# Patient Record
Sex: Male | Born: 1975
Health system: Southern US, Community
[De-identification: ages and names within clinical notes are randomized; demographics above are authoritative.]

## PROBLEM LIST (undated history)

## (undated) DIAGNOSIS — I10 Essential (primary) hypertension: Secondary | ICD-10-CM

## (undated) DIAGNOSIS — E7800 Pure hypercholesterolemia, unspecified: Secondary | ICD-10-CM

---

## 2011-02-10 ENCOUNTER — Emergency Department (HOSPITAL_BASED_OUTPATIENT_CLINIC_OR_DEPARTMENT_OTHER)
Admission: EM | Admit: 2011-02-10 | Discharge: 2011-02-10 | Disposition: A | Discharge status: Home / Self Care | Payer: Self-pay | Attending: Emergency Medicine | Admitting: Emergency Medicine

## 2011-02-10 DIAGNOSIS — F17200 Nicotine dependence, unspecified, uncomplicated: Secondary | ICD-10-CM | POA: Insufficient documentation

## 2011-02-10 DIAGNOSIS — R109 Unspecified abdominal pain: Secondary | ICD-10-CM | POA: Insufficient documentation

## 2011-02-10 LAB — URINALYSIS, ROUTINE W REFLEX MICROSCOPIC
Glucose, UA: NEGATIVE mg/dL
Hgb urine dipstick: NEGATIVE
Specific Gravity, Urine: 1.026 (ref 1.005–1.030)
pH: 6 (ref 5.0–8.0)

## 2011-02-12 LAB — GC/CHLAMYDIA PROBE AMP, GENITAL
Chlamydia, DNA Probe: NEGATIVE
GC Probe Amp, Genital: NEGATIVE

## 2013-07-09 ENCOUNTER — Emergency Department (HOSPITAL_BASED_OUTPATIENT_CLINIC_OR_DEPARTMENT_OTHER)
Admission: EM | Admit: 2013-07-09 | Discharge: 2013-07-10 | Disposition: A | Discharge status: Home / Self Care | Payer: Self-pay | Attending: Emergency Medicine | Admitting: Emergency Medicine

## 2013-07-09 ENCOUNTER — Encounter (HOSPITAL_BASED_OUTPATIENT_CLINIC_OR_DEPARTMENT_OTHER): Payer: Self-pay | Admitting: *Deleted

## 2013-07-09 ENCOUNTER — Emergency Department (HOSPITAL_BASED_OUTPATIENT_CLINIC_OR_DEPARTMENT_OTHER): Payer: Self-pay

## 2013-07-09 DIAGNOSIS — X58XXXA Exposure to other specified factors, initial encounter: Secondary | ICD-10-CM | POA: Insufficient documentation

## 2013-07-09 DIAGNOSIS — Y929 Unspecified place or not applicable: Secondary | ICD-10-CM | POA: Insufficient documentation

## 2013-07-09 DIAGNOSIS — S8392XA Sprain of unspecified site of left knee, initial encounter: Secondary | ICD-10-CM

## 2013-07-09 DIAGNOSIS — Y9389 Activity, other specified: Secondary | ICD-10-CM | POA: Insufficient documentation

## 2013-07-09 DIAGNOSIS — F17200 Nicotine dependence, unspecified, uncomplicated: Secondary | ICD-10-CM | POA: Insufficient documentation

## 2013-07-09 DIAGNOSIS — IMO0002 Reserved for concepts with insufficient information to code with codable children: Secondary | ICD-10-CM | POA: Insufficient documentation

## 2013-07-09 NOTE — ED Provider Notes (Signed)
CSN: [id number]     Arrival date & time 07/09/13  2326 History   First MD Initiated Contact with Patient 07/09/13 2336     Chief Complaint  Patient presents with  . Knee Injury   (Consider location/radiation/quality/duration/timing/severity/associated sxs/prior Treatment) Patient is a 37 y.o. male presenting with knee pain. The history is provided by the patient.  Knee Pain Location:  Knee Time since incident:  6 days Injury: yes   Mechanism of injury comment:  Playing basketball  Knee location:  L knee Pain details:    Quality:  Aching   Radiates to:  Does not radiate   Severity:  Moderate   Onset quality:  Sudden   Timing:  Intermittent   Progression:  Unchanged Chronicity:  New Dislocation: no   Foreign body present:  No foreign bodies Prior injury to area:  No Relieved by:  Nothing Worsened by:  Nothing tried Ineffective treatments:  None tried Associated symptoms: no back pain and no stiffness   Risk factors: no concern for non-accidental trauma     History reviewed. No pertinent past medical history. History reviewed. No pertinent past surgical history. History reviewed. No pertinent family history. History  Substance Use Topics  . Smoking status: Current Every Day Smoker -- 0.50 packs/day    Types: Cigarettes  . Smokeless tobacco: Not on file  . Alcohol Use: No    Review of Systems  Musculoskeletal: Negative for back pain and stiffness.  All other systems reviewed and are negative.    Allergies  Review of patient's allergies indicates no known allergies.  Home Medications  No current outpatient prescriptions on file. BP 154/86  Pulse 86  Temp(Src) 98.8 F (37.1 C) (Oral)  Resp 18  Ht 5\' 10"  (1.778 m)  Wt 182 lb (82.555 kg)  BMI 26.11 kg/m2  SpO2 100% Physical Exam  Constitutional: He is oriented to person, place, and time. He appears well-developed and well-nourished. No distress.  HENT:  Head: Normocephalic and atraumatic.  Mouth/Throat:  Oropharynx is clear and moist.  Eyes: Conjunctivae are normal. Pupils are equal, round, and reactive to light.  Neck: Normal range of motion. Neck supple.  Cardiovascular: Normal rate, regular rhythm and intact distal pulses.   Pulmonary/Chest: Effort normal and breath sounds normal. He has no wheezes. He has no rales.  Abdominal: Soft. Bowel sounds are normal. There is no tenderness. There is no rebound and no guarding.  Musculoskeletal: Normal range of motion. He exhibits no edema.  Intact quadriceps and patellar tendons.  Negative anterior and posterior drawer tests.  No laxity to varus or valgus stress.  No patella alta or [place]  Neurological: He is alert and oriented to person, place, and time. He has normal reflexes.  Skin: Skin is warm and dry.  Psychiatric: He has a normal mood and affect.    ED Course  Procedures (including critical care time) Labs Review Labs Reviewed - No data to display Imaging Review No results found.  MDM  No diagnosis found. Knee sleeve ice elevation and pain medication    Deniesha Stenglein K Almir Botts-Rasch, MD 07/10/13 [phone]

## 2013-07-09 NOTE — ED Notes (Signed)
MD at bedside. 

## 2013-07-09 NOTE — ED Notes (Signed)
Pt c/o left knee injury while playing basketball x 6 days

## 2013-07-10 MED ORDER — NAPROXEN 500 MG PO TABS: 500.0000 mg | ORAL_TABLET | Freq: Two times a day (BID) | ORAL | Status: DC

## 2013-07-10 MED ORDER — TRAMADOL HCL 50 MG PO TABS: 50.0000 mg | ORAL_TABLET | Freq: Four times a day (QID) | ORAL | Status: DC | PRN

## 2013-07-10 NOTE — ED Notes (Signed)
Patient transported to X-ray 

## 2014-03-10 ENCOUNTER — Encounter (HOSPITAL_BASED_OUTPATIENT_CLINIC_OR_DEPARTMENT_OTHER): Payer: Self-pay | Admitting: Emergency Medicine

## 2014-03-10 ENCOUNTER — Other Ambulatory Visit: Payer: Self-pay

## 2014-03-10 ENCOUNTER — Emergency Department (HOSPITAL_BASED_OUTPATIENT_CLINIC_OR_DEPARTMENT_OTHER)
Admission: EM | Admit: 2014-03-10 | Discharge: 2014-03-11 | Disposition: A | Discharge status: Home / Self Care | Payer: Self-pay | Attending: Emergency Medicine | Admitting: Emergency Medicine

## 2014-03-10 DIAGNOSIS — M5412 Radiculopathy, cervical region: Secondary | ICD-10-CM | POA: Insufficient documentation

## 2014-03-10 DIAGNOSIS — F17200 Nicotine dependence, unspecified, uncomplicated: Secondary | ICD-10-CM | POA: Insufficient documentation

## 2014-03-10 DIAGNOSIS — M5410 Radiculopathy, site unspecified: Secondary | ICD-10-CM

## 2014-03-10 DIAGNOSIS — Z791 Long term (current) use of non-steroidal anti-inflammatories (NSAID): Secondary | ICD-10-CM | POA: Insufficient documentation

## 2014-03-10 MED ORDER — KETOROLAC TROMETHAMINE 60 MG/2ML IM SOLN
60.0000 mg | Freq: Once | INTRAMUSCULAR | Status: AC
  Administered 2014-03-10: 60 mg via INTRAMUSCULAR
  Filled 2014-03-10: qty 2

## 2014-03-10 NOTE — ED Notes (Signed)
Neck pain for 2 weeks. Pain starts in the back of his neck and goes under his left arm and into his left chest.

## 2014-03-10 NOTE — ED Provider Notes (Signed)
CSN: [id number]     Arrival date & time 03/10/14  2005 History  This chart was scribed for Brendan Ruiz F Ranessa Kosta, MD by Luisa DagoPriscilla Tutu, ED Scribe. This patient was seen in room MH03/MH03 and the patient's care was started at 11:10 PM.     Chief Complaint  Patient presents with  . Neck Pain   The history is provided by the patient. No language interpreter was used.   HPI Comments: Brendan Ruiz is a 38 y.o. male who presents to the Emergency Department complaining of worsening neck pain that started approximately 2 weeks ago. Pt states that the pain is located in the back of his neck and radiates down into his left arm and around into the left side of his chest. He states that when he turns his neck a certain way he experiences a "shooting pain". Pt denies any recent injury or heavy lifting. He reports taking Ibuprofen with no relief. He currently rates his pain as a 5/10. He denies any pertinent medical history. Pt denies any fever, chills, or diaphoresis.   Pt does not have a PCP  History reviewed. No pertinent past medical history. History reviewed. No pertinent past surgical history. No family history on file. History  Substance Use Topics  . Smoking status: Current Every Day Smoker -- 0.50 packs/day    Types: Cigarettes  . Smokeless tobacco: Not on file  . Alcohol Use: No    Review of Systems  Musculoskeletal: Positive for neck pain. Negative for back pain.  Neurological: Negative for dizziness, weakness and numbness.  All other systems reviewed and are negative.     Allergies  Review of patient's allergies indicates no known allergies.  Home Medications   Prior to Admission medications   Medication Sig Start Date End Date Taking? Authorizing Provider  naproxen (NAPROSYN) 500 MG tablet Take 1 tablet (500 mg total) by mouth 2 (two) times daily. 07/10/13   April K Palumbo-Rasch, MD  traMADol (ULTRAM) 50 MG tablet Take 1 tablet (50 mg total) by mouth every 6 (six) hours as needed  for pain. 07/10/13   April K Palumbo-Rasch, MD   Triage Vitals: BP 148/84  Pulse 81  Temp(Src) 98.5 F (36.9 C) (Oral)  Resp 20  Ht 5\' 11"  (1.803 m)  Wt 195 lb (88.451 kg)  BMI 27.21 kg/m2  SpO2 100%  Physical Exam  Nursing note and vitals reviewed. Constitutional: He is oriented to person, place, and time. He appears well-developed and well-nourished.  HENT:  Head: Normocephalic and atraumatic.  Eyes: Pupils are equal, round, and reactive to light.  Neck: Normal range of motion. Neck supple.  Cardiovascular: Normal rate, regular rhythm and normal heart sounds.   No murmur heard. Pulmonary/Chest: Effort normal and breath sounds normal. No respiratory distress. He has no wheezes.  Musculoskeletal: He exhibits no edema.  Normal range of motion of the left elbow and shoulder, no obvious deformity or injury,   Lymphadenopathy:    He has no cervical adenopathy.  Neurological: He is alert and oriented to person, place, and time.  5 out of 5 deltoid, biceps, triceps, and grip strength in the bilateral opportunities, sensation intact  Skin: Skin is warm and dry.  Psychiatric: He has a normal mood and affect.    ED Course  Procedures (including critical care time)  DIAGNOSTIC STUDIES: Oxygen Saturation is 100% on RA, normal by my interpretation.    COORDINATION OF CARE: 11:13 PM-Patient / Family / Caregiver informed of clinical course, understand medical decision-making  process, and agree with plan.    Labs Review Labs Reviewed - No data to display  Imaging Review No results found.   EKG Interpretation None      Medications  ketorolac (TORADOL) injection 60 mg (60 mg Intramuscular Given 03/10/14 2346)     MDM   Final diagnoses:  Radiculopathy of arm    Patient presents with pain in his neck that shoots down his left arm. He is nontoxic-appearing. History is most consistent with radicular pain given that the pain is worse with neck movement. No known injury. On  physical exam, patient has full right and sensation and is only complaining of pain. Given reassuring neurologic exam, do not feel imaging is needed at this time. Patient was given Toradol. He will be discharged home on a Medrol Dosepak and a short course of Norco. He was referred to sports medicine for further evaluation.  After history, exam, and medical workup I feel the patient has been appropriately medically screened and is safe for discharge home. Pertinent diagnoses were discussed with the patient. Patient was given return precautions.  I personally performed the services described in this documentation, which was scribed in my presence. The recorded information has been reviewed and is accurate.    Brendan Ruiz F [name], MD 03/11/14 [phone]

## 2014-03-11 MED ORDER — METHYLPREDNISOLONE (PAK) 4 MG PO TABS: ORAL_TABLET | ORAL | Status: DC

## 2014-03-11 MED ORDER — HYDROCODONE-ACETAMINOPHEN 5-325 MG PO TABS: 1.0000 | ORAL_TABLET | Freq: Four times a day (QID) | ORAL | Status: DC | PRN

## 2014-03-11 NOTE — Discharge Instructions (Signed)
Cervical Radiculopathy  Cervical radiculopathy happens when a nerve in the neck is pinched or bruised by a slipped (herniated) disk or by arthritic changes in the bones of the cervical spine. This can occur due to an injury or as part of the normal aging process. Pressure on the cervical nerves can cause pain or numbness that runs from your neck all the way down into your arm and fingers.  CAUSES   There are many possible causes, including:  · Injury.  · Muscle tightness in the neck from overuse.  · Swollen, painful joints (arthritis).  · Breakdown or degeneration in the bones and joints of the spine (spondylosis) due to aging.  · Bone spurs that may develop near the cervical nerves.  SYMPTOMS   Symptoms include pain, weakness, or numbness in the affected arm and hand. Pain can be severe or irritating. Symptoms may be worse when extending or turning the neck.  DIAGNOSIS   Your caregiver will ask about your symptoms and do a physical exam. He or she may test your strength and reflexes. X-rays, CT scans, and MRI scans may be needed in cases of injury or if the symptoms do not go away after a period of time. Electromyography (EMG) or nerve conduction testing may be done to study how your nerves and muscles are working.  TREATMENT   Your caregiver may recommend certain exercises to help relieve your symptoms. Cervical radiculopathy can, and often does, get better with time and treatment. If your problems continue, treatment options may include:  · Wearing a soft collar for short periods of time.  · Physical therapy to strengthen the neck muscles.  · Medicines, such as nonsteroidal anti-inflammatory drugs (NSAIDs), oral corticosteroids, or spinal injections.  · Surgery. Different types of surgery may be done depending on the cause of your problems.  HOME CARE INSTRUCTIONS   · Put ice on the affected area.  · Put ice in a plastic bag.  · Place a towel between your skin and the bag.  · Leave the ice on for 15-20 minutes,  03-04 times a day or as directed by your caregiver.  · If ice does not help, you can try using heat. Take a warm shower or bath, or use a hot water bottle as directed by your caregiver.  · You may try a gentle neck and shoulder massage.  · Use a flat pillow when you sleep.  · Only take over-the-counter or prescription medicines for pain, discomfort, or fever as directed by your caregiver.  · If physical therapy was prescribed, follow your caregiver's directions.  · If a soft collar was prescribed, use it as directed.  SEEK IMMEDIATE MEDICAL CARE IF:   · Your pain gets much worse and cannot be controlled with medicines.  · You have weakness or numbness in your hand, arm, face, or leg.  · You have a high fever or a stiff, rigid neck.  · You lose bowel or bladder control (incontinence).  · You have trouble with walking, balance, or speaking.  MAKE SURE YOU:   · Understand these instructions.  · Will watch your condition.  · Will get help right away if you are not doing well or get worse.  Document Released: 07/16/2001 Document Revised: 01/13/2012 Document Reviewed: 06/04/2011  [employer]® Patient Information ©2014 [employer].

## 2014-03-21 ENCOUNTER — Ambulatory Visit (INDEPENDENT_AMBULATORY_CARE_PROVIDER_SITE_OTHER): Payer: Self-pay | Admitting: Family Medicine

## 2014-03-21 ENCOUNTER — Encounter: Payer: Self-pay | Admitting: Family Medicine

## 2014-03-21 VITALS — BP 158/99 | HR 114 | Ht 70.0 in | Wt 205.0 lb

## 2014-03-21 DIAGNOSIS — M542 Cervicalgia: Secondary | ICD-10-CM

## 2014-03-21 MED ORDER — CYCLOBENZAPRINE HCL 10 MG PO TABS: 10.0000 mg | ORAL_TABLET | Freq: Three times a day (TID) | ORAL | Status: DC | PRN

## 2014-03-21 MED ORDER — PREDNISONE (PAK) 10 MG PO TABS: ORAL_TABLET | ORAL | Status: DC

## 2014-03-21 MED ORDER — HYDROCODONE-ACETAMINOPHEN 5-325 MG PO TABS: 1.0000 | ORAL_TABLET | Freq: Four times a day (QID) | ORAL | Status: DC | PRN

## 2014-03-21 NOTE — Patient Instructions (Signed)
You have cervical radiculopathy (a pinched nerve in the neck). Prednisone 12 day dose pack to relieve irritation/inflammation of the nerve. Aleve 2 tabs twice a day with food for pain and inflammation - start day AFTER finishing prednisone if prescribed this. Flexeril three times a day as needed for muscle spasms (can make you sleepy - if so do not drive while taking this). Norco as needed for severe pain (no driving on this medicine). Consider cervical collar if severely painful. Simple range of motion exercises within limits of pain to prevent further stiffness. Consider physical therapy for stretching, exercises, traction, and modalities - call us when you know about cone coverage, insurance and we can order this. Heat 15 minutes at a time 3-4 times a day to help with spasms. Watch head position when on computers, texting, when sleeping in bed - should in line with back to prevent further nerve traction and irritation. If not improving we will consider an MRI. Follow up with me in 1 month.

## 2014-03-22 ENCOUNTER — Encounter: Payer: Self-pay | Admitting: Family Medicine

## 2014-03-22 DIAGNOSIS — M542 Cervicalgia: Secondary | ICD-10-CM | POA: Insufficient documentation

## 2014-03-22 NOTE — Progress Notes (Signed)
Patient ID: Brendan Ruiz, male   DOB: 1976-08-21, 38 y.o.   MRN: [id number]  PCP: No PCP Per Patient  Subjective:   HPI: Patient is a 38 y.o. male here for left shoulder, neck pain.  Patient reports pain started about a month ago when he felt like he slept wrong. Woke up with pain in left side of neck, chest radiating to left shoulder and back of left arm. Worse in the mornings. In ED was evaluated and put on medrol dose pack which did provide him with improvement. No prior issues. No weakness. No bowel/bladder dysfunction. Some numbness in areas mentioned above.  History reviewed. No pertinent past medical history.  Current Outpatient Prescriptions on File Prior to Visit  Medication Sig Dispense Refill  . naproxen (NAPROSYN) 500 MG tablet Take 1 tablet (500 mg total) by mouth 2 (two) times daily.  30 tablet  0   No current facility-administered medications on file prior to visit.    History reviewed. No pertinent past surgical history.  No Known Allergies  History   Social History  . Marital Status: Single    Spouse Name: N/A    Number of Children: N/A  . Years of Education: N/A   Occupational History  . Not on file.   Social History Main Topics  . Smoking status: Current Every Day Smoker -- 0.50 packs/day    Types: Cigarettes  . Smokeless tobacco: Not on file  . Alcohol Use: No  . Drug Use: No  . Sexual Activity: No   Other Topics Concern  . Not on file   Social History Narrative  . No narrative on file    History reviewed. No pertinent family history.  BP 158/99  Pulse 114  Ht 5\' 10"  (1.778 m)  Wt 205 lb (92.987 kg)  BMI 29.41 kg/m2  Review of Systems: See HPI above.    Objective:  Physical Exam:  Gen: NAD  Neck: No gross deformity, swelling, bruising. TTP mildly left trapezius, cervical paraspinal region.  No midline/bony TTP. FROM neck - pain with right lateral rotation reproducing his pain.Marland Kitchen. BUE strength 5/5.   Sensation intact to  light touch.   2+ equal reflexes in triceps, biceps, brachioradialis tendons. Positive spurlings on left. NV intact distal BUEs.    Assessment & Plan:  1. Neck pain - 2/2 radiculopathy.  Incomplete relief with a 6 day steroid dose pack - will provide 12 day dose pack to see if this will help with remaining symptoms.  Flexeril, norco as needed.  Unfortunately does not have insurance coverage to start physical therapy - will work on this and call us when this is the case.  F/u in 1 month

## 2014-03-22 NOTE — Assessment & Plan Note (Signed)
2/2 radiculopathy.  Incomplete relief with a 6 day steroid dose pack - will provide 12 day dose pack to see if this will help with remaining symptoms.  Flexeril, norco as needed.  Unfortunately does not have insurance coverage to start physical therapy - will work on this and call us when this is the case.  F/u in 1 month

## 2014-04-18 ENCOUNTER — Ambulatory Visit (INDEPENDENT_AMBULATORY_CARE_PROVIDER_SITE_OTHER): Payer: Self-pay | Admitting: Family Medicine

## 2014-04-18 ENCOUNTER — Encounter: Payer: Self-pay | Admitting: Family Medicine

## 2014-04-18 VITALS — BP 144/94 | HR 106 | Ht 70.0 in | Wt 205.0 lb

## 2014-04-18 DIAGNOSIS — M542 Cervicalgia: Secondary | ICD-10-CM

## 2014-04-19 ENCOUNTER — Encounter: Payer: Self-pay | Admitting: Family Medicine

## 2014-04-19 NOTE — Progress Notes (Signed)
Patient ID: Brendan Ruiz, male   DOB: 09-Oct-1976, 38 y.o.   MRN: [id number]  PCP: No PCP Per Patient  Subjective:   HPI: Patient is a 38 y.o. male here for left shoulder, neck pain.  5/18: Patient reports pain started about a month ago when he felt like he slept wrong. Woke up with pain in left side of neck, chest radiating to left shoulder and back of left arm. Worse in the mornings. In ED was evaluated and put on medrol dose pack which did provide him with improvement. No prior issues. No weakness. No bowel/bladder dysfunction. Some numbness in areas mentioned above.  6/15: Patient reports he feels better but still with 3/10 pain. 12 day prednisone dose pack helped. Has pain at times still. ROM is improved. Still doesn't have insurance - is changing jobs and will have to wait on benefits there. Hasn't done PT as a result. Left arm does feel weak when holding something. Chest has improved too. Occasionally takes muscle relaxant. No night pain.  History reviewed. No pertinent past medical history.  Current Outpatient Prescriptions on File Prior to Visit  Medication Sig Dispense Refill  . cyclobenzaprine (FLEXERIL) 10 MG tablet Take 1 tablet (10 mg total) by mouth every 8 (eight) hours as needed for muscle spasms.  60 tablet  1  . HYDROcodone-acetaminophen (NORCO/VICODIN) 5-325 MG per tablet Take 1 tablet by mouth every 6 (six) hours as needed for severe pain.  60 tablet  0  . naproxen (NAPROSYN) 500 MG tablet Take 1 tablet (500 mg total) by mouth 2 (two) times daily.  30 tablet  0  . predniSONE (STERAPRED UNI-PAK) 10 MG tablet 6 tabs po days 1-2, 5 tabs po days 3-4, 4 tabs po days 5-6, 3 tabs po days 7-8, 2 tabs po days 9-10, 1 tab po days 11-12  42 tablet  0   No current facility-administered medications on file prior to visit.    History reviewed. No pertinent past surgical history.  No Known Allergies  History   Social History  . Marital Status: Single    Spouse  Name: N/A    Number of Children: N/A  . Years of Education: N/A   Occupational History  . Not on file.   Social History Main Topics  . Smoking status: Current Every Day Smoker -- 0.50 packs/day    Types: Cigarettes  . Smokeless tobacco: Not on file  . Alcohol Use: No  . Drug Use: No  . Sexual Activity: No   Other Topics Concern  . Not on file   Social History Narrative  . No narrative on file    History reviewed. No pertinent family history.  BP 144/94  Pulse 106  Ht 5\' 10"  (1.778 m)  Wt 205 lb (92.987 kg)  BMI 29.41 kg/m2  Review of Systems: See HPI above.    Objective:  Physical Exam:  Gen: NAD  Neck: No gross deformity, swelling, bruising. TTP minimally left trapezius, cervical paraspinal region.  No midline/bony TTP. FROM neck - minimal pain with right lateral rotation reproducing his pain.Marland Kitchen. BUE strength 5/5.   Sensation intact to light touch.   2+ equal reflexes in triceps, biceps, brachioradialis tendons. Negative spurlings on left. NV intact distal BUEs.    Assessment & Plan:  1. Neck pain - 2/2 radiculopathy.  Better with 12 day dose pack.  Still awaiting insurance to go ahead with therapy - advised him to call us when this is the case and if he's still  having difficulties (if completely improved would not do therapy).  Flexeril as needed.  Otherwise f/u prn.

## 2014-04-19 NOTE — Assessment & Plan Note (Signed)
2/2 radiculopathy.  Better with 12 day dose pack.  Still awaiting insurance to go ahead with therapy - advised him to call us when this is the case and if he's still having difficulties (if completely improved would not do therapy).  Flexeril as needed.  Otherwise f/u prn.

## 2016-02-04 ENCOUNTER — Emergency Department (HOSPITAL_BASED_OUTPATIENT_CLINIC_OR_DEPARTMENT_OTHER)
Admission: EM | Admit: 2016-02-04 | Discharge: 2016-02-04 | Disposition: A | Discharge status: Home / Self Care | Payer: Managed Care, Other (non HMO) | Attending: Emergency Medicine | Admitting: Emergency Medicine

## 2016-02-04 ENCOUNTER — Encounter (HOSPITAL_BASED_OUTPATIENT_CLINIC_OR_DEPARTMENT_OTHER): Payer: Self-pay | Admitting: *Deleted

## 2016-02-04 DIAGNOSIS — J069 Acute upper respiratory infection, unspecified: Secondary | ICD-10-CM | POA: Diagnosis not present

## 2016-02-04 DIAGNOSIS — Z791 Long term (current) use of non-steroidal anti-inflammatories (NSAID): Secondary | ICD-10-CM | POA: Diagnosis not present

## 2016-02-04 DIAGNOSIS — F17210 Nicotine dependence, cigarettes, uncomplicated: Secondary | ICD-10-CM | POA: Insufficient documentation

## 2016-02-04 DIAGNOSIS — X58XXXA Exposure to other specified factors, initial encounter: Secondary | ICD-10-CM | POA: Insufficient documentation

## 2016-02-04 DIAGNOSIS — Y9289 Other specified places as the place of occurrence of the external cause: Secondary | ICD-10-CM | POA: Insufficient documentation

## 2016-02-04 DIAGNOSIS — Z7951 Long term (current) use of inhaled steroids: Secondary | ICD-10-CM | POA: Diagnosis not present

## 2016-02-04 DIAGNOSIS — S161XXA Strain of muscle, fascia and tendon at neck level, initial encounter: Secondary | ICD-10-CM | POA: Diagnosis not present

## 2016-02-04 DIAGNOSIS — Y998 Other external cause status: Secondary | ICD-10-CM | POA: Insufficient documentation

## 2016-02-04 DIAGNOSIS — J029 Acute pharyngitis, unspecified: Secondary | ICD-10-CM | POA: Diagnosis present

## 2016-02-04 DIAGNOSIS — Y9389 Activity, other specified: Secondary | ICD-10-CM | POA: Diagnosis not present

## 2016-02-04 LAB — RAPID STREP SCREEN (MED CTR MEBANE ONLY): STREPTOCOCCUS, GROUP A SCREEN (DIRECT): NEGATIVE

## 2016-02-04 MED ORDER — FLUTICASONE PROPIONATE 50 MCG/ACT NA SUSP: 2.0000 | Freq: Every day | NASAL | Status: DC

## 2016-02-04 MED ORDER — IBUPROFEN 800 MG PO TABS: 800.0000 mg | ORAL_TABLET | Freq: Three times a day (TID) | ORAL | Status: DC

## 2016-02-04 NOTE — ED Notes (Signed)
Rx x 2 given at discharge for flonase and ibuprofen. Note for work given

## 2016-02-04 NOTE — Discharge Instructions (Signed)
1. Medications: flonase for nasal congestion, ibuprofen as needed for neck pain, continue usual home medications 2. Treatment: rest, drink plenty of fluids 3. Follow Up: Please follow up with your primary doctor in 3 days for discussion of your diagnoses and further evaluation after today's visit; Return to the ER for high fevers, difficulty breathing or other concerning symptoms

## 2016-02-04 NOTE — ED Notes (Signed)
Sore throat x 3 days

## 2016-02-04 NOTE — ED Provider Notes (Signed)
CSN: [id number]     Arrival date & time 02/04/16  0945 History   First MD Initiated Contact with Patient 02/04/16 1000     Chief Complaint  Patient presents with  . Sore Throat     (Consider location/radiation/quality/duration/timing/severity/associated sxs/prior Treatment) Patient is a 40 y.o. male presenting with pharyngitis. The history is provided by the patient and medical records. No language interpreter was used.  Sore Throat Associated symptoms include congestion, myalgias, neck pain and a sore throat. Pertinent negatives include no abdominal pain, coughing, fever, headaches, nausea, rash, vomiting or weakness.  Brendan Ruiz is a 40 y.o. male  with no pertinent PMH who presents to the Emergency Department complaining of three day history of persistent, unchanging sore throat. Associated symptoms include nasal congestion. Alka-Seltzer and salt water gargles tried with Rotter relief. Sore throat worse with swallowing. Young son at home recently diagnosed with flu, no other sick contacts. Patient is also complaining of right-sided neck pain that began yesterday. 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 Patient denies fever, cough, shortness of breath, abdominal pain, chest pain.   History reviewed. No pertinent past medical history. History reviewed. No pertinent past surgical history. No family history on file. Social History  Substance Use Topics  . Smoking status: Current Every Day Smoker -- 0.50 packs/day    Types: Cigarettes  . Smokeless tobacco: Never Used  . Alcohol Use: No    Review of Systems  Constitutional: Negative for fever.  HENT: Positive for congestion and sore throat.   Eyes: Negative for visual disturbance.  Respiratory: Negative for cough and shortness of breath.   Cardiovascular: Negative.   Gastrointestinal: Negative for nausea, vomiting and abdominal pain.  Musculoskeletal: Positive for myalgias and neck  pain. Negative for back pain.  Skin: Negative for rash.  Allergic/Immunologic: Negative for immunocompromised state.  Neurological: Negative for dizziness, weakness and headaches.      Allergies  Review of patient's allergies indicates no known allergies.  Home Medications   Prior to Admission medications   Medication Sig Start Date End Date Taking? Authorizing Provider  cyclobenzaprine (FLEXERIL) 10 MG tablet Take 1 tablet (10 mg total) by mouth every 8 (eight) hours as needed for muscle spasms. 03/21/14   Lenda KelpShane R Hudnall, MD  fluticasone (FLONASE) 50 MCG/ACT nasal spray Place 2 sprays into both nostrils daily. 02/04/16   Chase PicketJaime Pilcher Mica Releford, PA-C  HYDROcodone-acetaminophen (NORCO/VICODIN) 5-325 MG per tablet Take 1 tablet by mouth every 6 (six) hours as needed for severe pain. 03/21/14   Lenda KelpShane R Hudnall, MD  ibuprofen (ADVIL,MOTRIN) 800 MG tablet Take 1 tablet (800 mg total) by mouth 3 (three) times daily. 02/04/16   Chase PicketJaime Pilcher Irelyn Perfecto, PA-C  naproxen (NAPROSYN) 500 MG tablet Take 1 tablet (500 mg total) by mouth 2 (two) times daily. 07/10/13   April Palumbo, MD  predniSONE (STERAPRED UNI-PAK) 10 MG tablet 6 tabs po days 1-2, 5 tabs po days 3-4, 4 tabs po days 5-6, 3 tabs po days 7-8, 2 tabs po days 9-10, 1 tab po days 11-12 03/21/14   Lenda KelpShane R Hudnall, MD   BP 139/88 mmHg  Pulse 64  Temp(Src) 98.3 F (36.8 C) (Oral)  Resp 18  Ht 5\' 11"  (1.803 m)  Wt 85.503 kg  BMI 26.30 kg/m2  SpO2 100% Physical Exam  Constitutional: He is oriented to person, place, and time. He appears well-developed and well-nourished. No distress.  HENT:  Head: Normocephalic and  atraumatic.  OP with erythema, no exudates or tonsillar hypertrophy. + nasal congestion with mucosal edema.   Neck: Normal range of motion. Neck supple.    TTP as depicted in image. No overlying skin changes. No neck, oral, or facial swelling. No meningeal signs.   Cardiovascular: Normal rate, regular rhythm and normal heart sounds.    Pulmonary/Chest: Effort normal.  Lungs are clear to auscultation bilaterally - no w/r/r  Abdominal: Soft. He exhibits no distension. There is no tenderness.  Musculoskeletal: Normal range of motion.  Neurological: He is alert and oriented to person, place, and time.  Skin: Skin is warm and dry. He is not diaphoretic.  Nursing note and vitals reviewed.   ED Course  Procedures (including critical care time) Labs Review Labs Reviewed  RAPID STREP SCREEN (NOT AT [facility])    Imaging Review No results found. I have personally reviewed and evaluated these images and lab results as part of my medical decision-making.   EKG Interpretation None      MDM   Final diagnoses:  URI (upper respiratory infection)  Neck strain, initial encounter   Brendan Ruiz is afebrile, non-toxic appearing with a clear lung exam. Mild rhinorrhea and OP with erythema, no exudates. Likely viral URI. Patient also complaining of right sided neck pain which appears to be musculoskeletal on exam. Will treat with ibuprofen. Patient is agreeable to symptomatic treatment for sore throat and nasal congestion with close follow up with PCP as needed but spoke at length about emergent, changing, or worsening of symptoms that should prompt return to ER. Patient voices understanding and is agreeable to plan.   Blood pressure 139/88, pulse 64, temperature 98.3 F (36.8 C), temperature source Oral, resp. rate 18, height  (1.803 m), weight 85.503 kg, SpO2 100 %.  Chase Picket Lorimer Tiberio, PA-C 02/04/16 [address] Lauralei Clouse, PA-C 02/04/16 1028  Linwood Dibbles, MD 02/04/16 1029

## 2016-02-06 LAB — CULTURE, GROUP A STREP (THRC)

## 2016-03-22 ENCOUNTER — Emergency Department (HOSPITAL_BASED_OUTPATIENT_CLINIC_OR_DEPARTMENT_OTHER): Payer: Managed Care, Other (non HMO)

## 2016-03-22 ENCOUNTER — Emergency Department (HOSPITAL_BASED_OUTPATIENT_CLINIC_OR_DEPARTMENT_OTHER)
Admission: EM | Admit: 2016-03-22 | Discharge: 2016-03-22 | Disposition: A | Discharge status: Home / Self Care | Payer: Managed Care, Other (non HMO) | Attending: Emergency Medicine | Admitting: Emergency Medicine

## 2016-03-22 ENCOUNTER — Encounter (HOSPITAL_BASED_OUTPATIENT_CLINIC_OR_DEPARTMENT_OTHER): Payer: Self-pay | Admitting: *Deleted

## 2016-03-22 DIAGNOSIS — F17210 Nicotine dependence, cigarettes, uncomplicated: Secondary | ICD-10-CM | POA: Diagnosis not present

## 2016-03-22 DIAGNOSIS — K5900 Constipation, unspecified: Secondary | ICD-10-CM | POA: Insufficient documentation

## 2016-03-22 DIAGNOSIS — R1011 Right upper quadrant pain: Secondary | ICD-10-CM | POA: Diagnosis present

## 2016-03-22 DIAGNOSIS — Z791 Long term (current) use of non-steroidal anti-inflammatories (NSAID): Secondary | ICD-10-CM | POA: Diagnosis not present

## 2016-03-22 DIAGNOSIS — R109 Unspecified abdominal pain: Secondary | ICD-10-CM

## 2016-03-22 LAB — COMPREHENSIVE METABOLIC PANEL
ALBUMIN: 4 g/dL (ref 3.5–5.0)
ALK PHOS: 61 U/L (ref 38–126)
ALT: 22 U/L (ref 17–63)
AST: 25 U/L (ref 15–41)
Anion gap: 7 (ref 5–15)
BUN: 17 mg/dL (ref 6–20)
CO2: 26 mmol/L (ref 22–32)
Calcium: 9 mg/dL (ref 8.9–10.3)
Chloride: 104 mmol/L (ref 101–111)
Creatinine, Ser: 1.31 mg/dL — ABNORMAL HIGH (ref 0.61–1.24)
GFR calc Af Amer: >60 mL/min (ref >60)
GFR calc non Af Amer: >60 mL/min (ref >60)
GLUCOSE: 116 mg/dL — AB (ref 65–99)
POTASSIUM: 4 mmol/L (ref 3.5–5.1)
SODIUM: 137 mmol/L (ref 135–145)
TOTAL PROTEIN: 7.4 g/dL (ref 6.5–8.1)
Total Bilirubin: 0.8 mg/dL (ref 0.3–1.2)

## 2016-03-22 LAB — CBC WITH DIFFERENTIAL/PLATELET
BASOS ABS: 0 10*3/uL (ref 0.0–0.1)
BASOS PCT: 1 %
EOS ABS: 0.2 10*3/uL (ref 0.0–0.7)
EOS PCT: 3 %
HCT: 41.5 % (ref 39.0–52.0)
HEMOGLOBIN: 14.3 g/dL (ref 13.0–17.0)
LYMPHS ABS: 2.9 10*3/uL (ref 0.7–4.0)
Lymphocytes Relative: 43 %
MCH: 30.6 pg (ref 26.0–34.0)
MCHC: 34.5 g/dL (ref 30.0–36.0)
MCV: 88.7 fL (ref 78.0–100.0)
Monocytes Absolute: 0.6 10*3/uL (ref 0.1–1.0)
Monocytes Relative: 9 %
NEUTROS PCT: 44 %
Neutro Abs: 3 10*3/uL (ref 1.7–7.7)
PLATELETS: 232 10*3/uL (ref 150–400)
RBC: 4.68 MIL/uL (ref 4.22–5.81)
RDW: 13.2 % (ref 11.5–15.5)
WBC: 6.7 10*3/uL (ref 4.0–10.5)

## 2016-03-22 LAB — URINALYSIS, ROUTINE W REFLEX MICROSCOPIC
BILIRUBIN URINE: NEGATIVE
Glucose, UA: NEGATIVE mg/dL
HGB URINE DIPSTICK: NEGATIVE
KETONES UR: NEGATIVE mg/dL
Leukocytes, UA: NEGATIVE
NITRITE: NEGATIVE
PROTEIN: NEGATIVE mg/dL
SPECIFIC GRAVITY, URINE: 1.023 (ref 1.005–1.030)
pH: 5.5 (ref 5.0–8.0)

## 2016-03-22 LAB — LIPASE, BLOOD: Lipase: 22 U/L (ref 11–51)

## 2016-03-22 MED ORDER — DICYCLOMINE HCL 20 MG PO TABS: 20.0000 mg | ORAL_TABLET | Freq: Two times a day (BID) | ORAL | Status: DC | PRN

## 2016-03-22 MED ORDER — POLYETHYLENE GLYCOL 3350 17 G PO PACK: 17.0000 g | PACK | Freq: Every day | ORAL | Status: DC | PRN

## 2016-03-22 MED FILL — POLYETHYLENE GLYCOL 3350 PO: 15 days supply | Qty: 238 | Fill #0

## 2016-03-22 MED FILL — DICYCLOMINE 20 MG TABLET: 20 | 10 days supply | Qty: 20 | Fill #0

## 2016-03-22 NOTE — Discharge Instructions (Signed)
Abdominal Pain, Adult °Many things can cause abdominal pain. Usually, abdominal pain is not caused by a disease and will improve without treatment. It can often be observed and treated at home. Your health care provider will do a physical exam and possibly order blood tests and X-rays to help determine the seriousness of your pain. However, in many cases, more time must pass before a clear cause of the pain can be found. Before that point, your health care provider may not know if you need more testing or further treatment. °HOME CARE INSTRUCTIONS °Monitor your abdominal pain for any changes. The following actions may help to alleviate any discomfort you are experiencing: °· Only take over-the-counter or prescription medicines as directed by your health care provider. °· Do not take laxatives unless directed to do so by your health care provider. °· Try a clear liquid diet (broth, tea, or water) as directed by your health care provider. Slowly move to a bland diet as tolerated. °SEEK MEDICAL CARE IF: °· You have unexplained abdominal pain. °· You have abdominal pain associated with nausea or diarrhea. °· You have pain when you urinate or have a bowel movement. °· You experience abdominal pain that wakes you in the night. °· You have abdominal pain that is worsened or improved by eating food. °· You have abdominal pain that is worsened with eating fatty foods. °· You have a fever. °SEEK IMMEDIATE MEDICAL CARE IF: °· Your pain does not go away within 2 hours. °· You keep throwing up (vomiting). °· Your pain is felt only in portions of the abdomen, such as the right side or the left lower portion of the abdomen. °· You pass bloody or black tarry stools. °MAKE SURE YOU: °· Understand these instructions. °· Will watch your condition. °· Will get help right away if you are not doing well or get worse. °  °This information is not intended to replace advice given to you by your health care provider. Make sure you discuss  any questions you have with your health care provider. °  °Document Released: 07/31/2005 Document Revised: 07/12/2015 Document Reviewed: 06/30/2013 °Elsevier Interactive Patient Education ©2016 [employer] ° °Constipation, Adult °Constipation is when a person has fewer than three bowel movements a week, has difficulty having a bowel movement, or has stools that are dry, hard, or larger than normal. As people grow older, constipation is more common. A low-fiber diet, not taking in enough fluids, and taking certain medicines may make constipation worse.  °CAUSES  °· Certain medicines, such as antidepressants, pain medicine, iron supplements, antacids, and water pills.   °· Certain diseases, such as diabetes, irritable bowel syndrome (IBS), thyroid disease, or depression.   °· Not drinking enough water.   °· Not eating enough fiber-rich foods.   °· Stress or travel.   °· Lack of physical activity or exercise.   °· Ignoring the urge to have a bowel movement.   °· Using laxatives too much.   °SIGNS AND SYMPTOMS  °· Having fewer than three bowel movements a week.   °· Straining to have a bowel movement.   °· Having stools that are hard, dry, or larger than normal.   °· Feeling full or bloated.   °· Pain in the lower abdomen.   °· Not feeling relief after having a bowel movement.   °DIAGNOSIS  °Your health care provider will take a medical history and perform a physical exam. Further testing may be done for severe constipation. Some tests may include: °· A barium enema X-ray to examine your rectum, colon, and, sometimes,   your small intestine.   °· A sigmoidoscopy to examine your lower colon.   °· A colonoscopy to examine your entire colon. °TREATMENT  °Treatment will depend on the severity of your constipation and what is causing it. Some dietary treatments include drinking more fluids and eating more fiber-rich foods. Lifestyle treatments may include regular exercise. If these diet and lifestyle recommendations do not  help, your health care provider may recommend taking over-the-counter laxative medicines to help you have bowel movements. Prescription medicines may be prescribed if over-the-counter medicines do not work.  °HOME CARE INSTRUCTIONS  °· Eat foods that have a lot of fiber, such as fruits, vegetables, whole grains, and beans. °· Limit foods high in fat and processed sugars, such as french fries, hamburgers, cookies, candies, and soda.   °· A fiber supplement may be added to your diet if you cannot get enough fiber from foods.   °· Drink enough fluids to keep your urine clear or pale yellow.   °· Exercise regularly or as directed by your health care provider.   °· Go to the restroom when you have the urge to go. Do not hold it.   °· Only take over-the-counter or prescription medicines as directed by your health care provider. Do not take other medicines for constipation without talking to your health care provider first.   °SEEK IMMEDIATE MEDICAL CARE IF:  °· You have bright red blood in your stool.   °· Your constipation lasts for more than 4 days or gets worse.   °· You have abdominal or rectal pain.   °· You have thin, pencil-like stools.   °· You have unexplained weight loss. °MAKE SURE YOU:  °· Understand these instructions. °· Will watch your condition. °· Will get help right away if you are not doing well or get worse. °  °This information is not intended to replace advice given to you by your health care provider. Make sure you discuss any questions you have with your health care provider. °  °Document Released: 07/19/2004 Document Revised: 11/11/2014 Document Reviewed: 08/02/2013 °Elsevier Interactive Patient Education ©2016 Elsevier Inc. ° °

## 2016-03-22 NOTE — ED Notes (Signed)
Abdominal bloating since yesterday. No pain. Feels like gas.

## 2016-03-22 NOTE — ED Provider Notes (Signed)
CSN: [id number]     Arrival date & time 03/22/16  1054 History   First MD Initiated Contact with Patient 03/22/16 1105     Chief Complaint  Patient presents with  . Abdominal Pain     (Consider location/radiation/quality/duration/timing/severity/associated sxs/prior Treatment) HPI Patient presents with abdominal distention starting yesterday. Had a bowel movement last yesterday morning. States it was normal. Also describes right upper quadrant "discomfort". States this discomfort is worse with deep breathing. He's had no nausea, vomiting or diarrhea. No fever or chills. States he took simethicone and Pepto-Bismol last night with no improvement. No previous abdominal surgery. Denies urinary symptoms. History reviewed. No pertinent past medical history. History reviewed. No pertinent past surgical history. No family history on file. Social History  Substance Use Topics  . Smoking status: Current Every Day Smoker -- 0.50 packs/day    Types: Cigarettes  . Smokeless tobacco: Never Used  . Alcohol Use: No    Review of Systems  Constitutional: Negative for fever and chills.  Respiratory: Negative for cough and shortness of breath.   Gastrointestinal: Positive for abdominal pain and abdominal distention. Negative for nausea, vomiting, diarrhea and constipation.  Genitourinary: Negative for dysuria, frequency, hematuria and flank pain.  Musculoskeletal: Negative for myalgias, back pain, joint swelling, neck pain and neck stiffness.  Skin: Negative for rash and wound.  Neurological: Negative for dizziness, weakness, light-headedness, numbness and headaches.  All other systems reviewed and are negative.     Allergies  Review of patient's allergies indicates no known allergies.  Home Medications   Prior to Admission medications   Medication Sig Start Date End Date Taking? Authorizing Provider  cyclobenzaprine (FLEXERIL) 10 MG tablet Take 1 tablet (10 mg total) by mouth every 8 (eight)  hours as needed for muscle spasms. 03/21/14   Lenda KelpShane R Hudnall, MD  dicyclomine (BENTYL) 20 MG tablet Take 1 tablet (20 mg total) by mouth 2 (two) times daily as needed for spasms. 03/22/16   Loren Raceravid Chesley Veasey, MD  fluticasone (FLONASE) 50 MCG/ACT nasal spray Place 2 sprays into both nostrils daily. 02/04/16   Chase PicketJaime Pilcher Ward, PA-C  HYDROcodone-acetaminophen (NORCO/VICODIN) 5-325 MG per tablet Take 1 tablet by mouth every 6 (six) hours as needed for severe pain. 03/21/14   Lenda KelpShane R Hudnall, MD  ibuprofen (ADVIL,MOTRIN) 800 MG tablet Take 1 tablet (800 mg total) by mouth 3 (three) times daily. 02/04/16   Chase PicketJaime Pilcher Ward, PA-C  naproxen (NAPROSYN) 500 MG tablet Take 1 tablet (500 mg total) by mouth 2 (two) times daily. 07/10/13   April Palumbo, MD  polyethylene glycol [facility](MIRALAX / GLYCOLAX) packet Take 17 g by mouth daily as needed for moderate constipation. 03/22/16   Loren Raceravid Alyssia Heese, MD  predniSONE (STERAPRED UNI-PAK) 10 MG tablet 6 tabs po days 1-2, 5 tabs po days 3-4, 4 tabs po days 5-6, 3 tabs po days 7-8, 2 tabs po days 9-10, 1 tab po days 11-12 03/21/14   Lenda KelpShane R Hudnall, MD   BP 153/101 mmHg  Pulse 66  Temp(Src) 98.2 F (36.8 C) (Oral)  Resp 16  Ht 5\' 11"  (1.803 m)  Wt 188 lb (85.276 kg)  BMI 26.23 kg/m2  SpO2 100% Physical Exam  Constitutional: He is oriented to person, place, and time. He appears well-developed and well-nourished. No distress.  HENT:  Head: Normocephalic and atraumatic.  Mouth/Throat: Oropharynx is clear and moist. No oropharyngeal exudate.  Eyes: EOM are normal. Pupils are equal, round, and reactive to light.  Neck: Normal range of motion. Neck  supple.  Cardiovascular: Normal rate and regular rhythm.   Pulmonary/Chest: Effort normal and breath sounds normal. No respiratory distress. He has no wheezes. He has no rales. He exhibits no tenderness.  Abdominal: Soft. Bowel sounds are normal. He exhibits distension. He exhibits no mass. There is tenderness (mild diffuse tenderness  with palpation. Appears worse in the right upper quadrant. No rebound or guarding. No masses appreciated.). There is no rebound and no guarding.  Musculoskeletal: Normal range of motion. He exhibits no edema or tenderness.  No CVA tenderness bilaterally. No lower extremity swelling or asymmetry.  Neurological: He is alert and oriented to person, place, and time.  5/5 motor in all extremities. Sensation is fully intact.  Skin: Skin is warm and dry. No rash noted. No erythema.  Psychiatric: He has a normal mood and affect. His behavior is normal.  Nursing note and vitals reviewed.   ED Course  Procedures (including critical care time) Labs Review Labs Reviewed  COMPREHENSIVE METABOLIC PANEL - Abnormal; Notable for the following:    Glucose, Bld 116 (*)    Creatinine, Ser 1.31 (*)    All other components within normal limits  URINALYSIS, ROUTINE W REFLEX MICROSCOPIC (NOT AT [facility])  CBC WITH DIFFERENTIAL/PLATELET  LIPASE, BLOOD    Imaging Review No results found. I have personally reviewed and evaluated these images and lab results as part of my medical decision-making.   EKG Interpretation None      MDM   Final diagnoses:  Abdominal discomfort  Constipation, unspecified constipation type    Patient has a normal white blood cell count. X-ray with stool throughout the colon. It appears most pronounced in the ascending colon. Suspect this is the source for his abdominal discomfort. Low suspicion for emergent surgical cause for the patient's pain.  We'll discharge home with bowel regimen. He's been given return precautions and is voiced understanding.  Loren Racer, MD 03/26/16 1320

## 2016-03-22 NOTE — ED Notes (Signed)
Patient transported to X-ray 

## 2017-07-29 ENCOUNTER — Emergency Department (HOSPITAL_BASED_OUTPATIENT_CLINIC_OR_DEPARTMENT_OTHER)
Admission: EM | Admit: 2017-07-29 | Discharge: 2017-07-29 | Disposition: A | Discharge status: Home / Self Care | Payer: 59 | Attending: Emergency Medicine | Admitting: Emergency Medicine

## 2017-07-29 ENCOUNTER — Encounter (HOSPITAL_BASED_OUTPATIENT_CLINIC_OR_DEPARTMENT_OTHER): Payer: Self-pay | Admitting: Emergency Medicine

## 2017-07-29 DIAGNOSIS — S161XXA Strain of muscle, fascia and tendon at neck level, initial encounter: Secondary | ICD-10-CM | POA: Diagnosis not present

## 2017-07-29 DIAGNOSIS — Y999 Unspecified external cause status: Secondary | ICD-10-CM | POA: Diagnosis not present

## 2017-07-29 DIAGNOSIS — Y939 Activity, unspecified: Secondary | ICD-10-CM | POA: Insufficient documentation

## 2017-07-29 DIAGNOSIS — F17210 Nicotine dependence, cigarettes, uncomplicated: Secondary | ICD-10-CM | POA: Insufficient documentation

## 2017-07-29 DIAGNOSIS — X58XXXA Exposure to other specified factors, initial encounter: Secondary | ICD-10-CM | POA: Insufficient documentation

## 2017-07-29 DIAGNOSIS — Y929 Unspecified place or not applicable: Secondary | ICD-10-CM | POA: Diagnosis not present

## 2017-07-29 DIAGNOSIS — M436 Torticollis: Secondary | ICD-10-CM | POA: Diagnosis present

## 2017-07-29 MED ORDER — CYCLOBENZAPRINE HCL 10 MG PO TABS: 10.0000 mg | ORAL_TABLET | Freq: Three times a day (TID) | ORAL | 0 refills | Status: DC | PRN

## 2017-07-29 MED ORDER — PREDNISONE 50 MG PO TABS: ORAL_TABLET | ORAL | 0 refills | Status: DC

## 2017-07-29 NOTE — ED Triage Notes (Signed)
Pt c/o right sided neck pain and into right arm x4 days. Pt denies any injury, reports hx of "pinched nerve."

## 2017-07-29 NOTE — ED Provider Notes (Signed)
[facility] Provider Note   CSN: [id number] Arrival date & time: 07/29/17  0522     History   Chief Complaint Chief Complaint  Patient presents with  . Torticollis    HPI Brendan Ruiz is a 41 y.o. male.  The history is provided by the patient.  Muscle Pain  This is a new problem. The current episode started more than 2 days ago. The problem occurs daily. The problem has been gradually worsening. Pertinent negatives include no chest pain and no headaches. The symptoms are aggravated by twisting and bending. The symptoms are relieved by rest. Treatments tried: ibuprofen. The treatment provided no relief.   Pt reports right sided neck pain worse over past 4 days He reports long h/o neck pain but it is worsening He has been seen by sports medicine before for "pinched nerve" He has had good response from steroids previously No trauma No arm weakness No other acute complaints  PMH -none Patient Active Problem List   Diagnosis Date Noted  . Neck pain 03/22/2014    History reviewed. No pertinent surgical history.     Home Medications    Prior to Admission medications   Medication Sig Start Date End Date Taking? Authorizing Provider  cyclobenzaprine (FLEXERIL) 10 MG tablet Take 1 tablet (10 mg total) by mouth 3 (three) times daily as needed for muscle spasms. 07/29/17   Zadie Rhine, MD  predniSONE (DELTASONE) 50 MG tablet 1 tablet PO QD X5 days 07/29/17   Zadie Rhine, MD    Family History No family history on file.  Social History Social History  Substance Use Topics  . Smoking status: Current Every Day Smoker    Packs/day: 0.50    Types: Cigarettes  . Smokeless tobacco: Never Used  . Alcohol use No     Allergies   Patient has no known allergies.   Review of Systems Review of Systems  Constitutional: Negative for fever.  Cardiovascular: Negative for chest pain.  Musculoskeletal: Positive for myalgias, neck pain and neck stiffness.    Neurological: Negative for weakness, numbness and headaches.  All other systems reviewed and are negative.    Physical Exam Updated Vital Signs BP (!) 160/100 (BP Location: Left Arm)   Pulse 76   Temp 98.1 F (36.7 C) (Oral)   Resp 18   Ht 1.778 m ( )   Wt 88.5 kg (195 lb)   SpO2 100%   BMI 27.98 kg/m   Physical Exam CONSTITUTIONAL: Well developed/well nourished HEAD: Normocephalic/atraumatic EYES: EOMI/PERRL ENMT: Mucous membranes moist NECK: supple no meningeal signs, pain worsened with neck flexion SPINE/BACK:entire spine nontender CV: S1/S2 noted, no murmurs/rubs/gallops noted LUNGS: Lungs are clear to auscultation bilaterally, no apparent distress ABDOMEN: soft NEURO: Pt is awake/alert/appropriate, moves all extremitiesx4.  No facial droop. Equal power (5/5) with hand grip, wrist flex/extension, elbow flex/extension, and equal power with shoulder abduction/adduction.  No focal sensory deficit to light touch is noted in either UE.   Equal (2+) biceps/brachioradialis/tricep reflex in bilateral UE  EXTREMITIES: pulses normal/equal, full ROM, tenderness over right trapezius, no warmth/erythema SKIN: warm, color normal PSYCH: no abnormalities of mood noted, alert and oriented to situation   ED Treatments / Results  Labs (all labs ordered are listed, but only abnormal results are displayed) Labs Reviewed - No data to display  EKG  EKG Interpretation None       Radiology No results found.  Procedures Procedures (including critical care time)  Medications Ordered in ED Medications -  No data to display   Initial Impression / Assessment and Plan / ED Course  I have reviewed the triage vital signs and the nursing notes.      Pt well appearing Similar to prior episodes No focal weakness Likely work related He had good response to steroids previously Will restart Referred to sports medicine   Final Clinical Impressions(s) / ED Diagnoses   Final  diagnoses:  Acute cervical myofascial strain, initial encounter    New Prescriptions New Prescriptions   CYCLOBENZAPRINE (FLEXERIL) 10 MG TABLET    Take 1 tablet (10 mg total) by mouth 3 (three) times daily as needed for muscle spasms.   PREDNISONE (DELTASONE) 50 MG TABLET    1 tablet PO QD X5 days     Zadie Rhine, MD 07/29/17 [phone]

## 2018-04-05 ENCOUNTER — Other Ambulatory Visit: Payer: Self-pay

## 2018-04-05 ENCOUNTER — Encounter (HOSPITAL_BASED_OUTPATIENT_CLINIC_OR_DEPARTMENT_OTHER): Payer: Self-pay | Admitting: Emergency Medicine

## 2018-04-05 ENCOUNTER — Emergency Department (HOSPITAL_BASED_OUTPATIENT_CLINIC_OR_DEPARTMENT_OTHER)
Admission: EM | Admit: 2018-04-05 | Discharge: 2018-04-05 | Disposition: A | Discharge status: Home / Self Care | Payer: 59 | Attending: Physician Assistant | Admitting: Physician Assistant

## 2018-04-05 ENCOUNTER — Emergency Department (HOSPITAL_BASED_OUTPATIENT_CLINIC_OR_DEPARTMENT_OTHER): Payer: 59

## 2018-04-05 DIAGNOSIS — M25562 Pain in left knee: Secondary | ICD-10-CM | POA: Diagnosis not present

## 2018-04-05 DIAGNOSIS — F17210 Nicotine dependence, cigarettes, uncomplicated: Secondary | ICD-10-CM | POA: Diagnosis not present

## 2018-04-05 MED ORDER — IBUPROFEN 800 MG PO TABS: 800.0000 mg | ORAL_TABLET | Freq: Three times a day (TID) | ORAL | 0 refills | Status: DC | PRN

## 2018-04-05 NOTE — Discharge Instructions (Signed)
It was my pleasure taking care of you today!   Ibuprofen as needed for pain. Ice and elevate knee throughout the day. Use your knee sleeve when you will be up on your feet.  Call the orthopedist listed on Friday if you are still having knee pain.   Return to the ER for new or worsening symptoms, any additional concerns.

## 2018-04-05 NOTE — ED Triage Notes (Signed)
L knee pain x 1 week. States he twisted it.

## 2018-04-05 NOTE — ED Provider Notes (Signed)
[facility] EMERGENCY DEPARTMENT Provider Note   CSN: [id number] Arrival date & time: 04/05/18  1000     History   Chief Complaint Chief Complaint  Patient presents with  . Knee Pain    HPI Brendan Ruiz is a 42 y.o. male.  The history is provided by the patient and medical records. No language interpreter was used.  Knee Pain   Pertinent negatives include no numbness.   Brendan Ruiz is a 42 y.o. male  who presents to the Emergency Department complaining of acute onset of left knee pain about 1 week ago.  Patient states that he was walking and turned funny when his knee twisted.  He has been having pain to the lateral aspect of the knee, mostly when he is up on his feet at work all day or ambulating.  Denies any pain while at rest.  He took 400 mg of ibuprofen which did help somewhat.  He denies any clicking, popping, catching, numbness, tingling, weakness.  He has been ambulatory, however this does exacerbate his pain as mentioned above.  He reports history of prior knee injury in high school to a ligament, but this did not require surgery and healed supportively.  History reviewed. No pertinent past medical history.  Patient Active Problem List   Diagnosis Date Noted  . Neck pain 03/22/2014    History reviewed. No pertinent surgical history.      Home Medications    Prior to Admission medications   Medication Sig Start Date End Date Taking? Authorizing Provider  cyclobenzaprine (FLEXERIL) 10 MG tablet Take 1 tablet (10 mg total) by mouth 3 (three) times daily as needed for muscle spasms. 07/29/17   Zadie RhineWickline, Donald, MD  ibuprofen (ADVIL,MOTRIN) 800 MG tablet Take 1 tablet (800 mg total) by mouth every 8 (eight) hours as needed. 04/05/18   Cynde Menard, Chase PicketJaime Pilcher, PA-C  predniSONE (DELTASONE) 50 MG tablet 1 tablet PO QD X5 days 07/29/17   Zadie RhineWickline, Donald, MD    Family History No family history on file.  Social History Social History   Tobacco Use  . Smoking  status: Current Every Day Smoker    Packs/day: 0.50    Types: Cigarettes  . Smokeless tobacco: Never Used  Substance Use Topics  . Alcohol use: No  . Drug use: No     Allergies   Patient has no known allergies.   Review of Systems Review of Systems  Musculoskeletal: Positive for arthralgias and myalgias. Negative for back pain and neck pain.  Skin: Negative for color change and wound.  Neurological: Negative for weakness and numbness.     Physical Exam Updated Vital Signs BP (!) 155/94 (BP Location: Left Arm)   Pulse 69   Temp 98.4 F (36.9 C) (Oral)   Resp 18   Ht 5\' 10"  (1.778 m)   Wt 87.1 kg (192 lb)   SpO2 99%   BMI 27.55 kg/m   Physical Exam  Constitutional: He appears well-developed and well-nourished. No distress.  HENT:  Head: Normocephalic and atraumatic.  Neck: Neck supple.  Cardiovascular: Normal rate, regular rhythm and normal heart sounds.  No murmur heard. Pulmonary/Chest: Effort normal and breath sounds normal. No respiratory distress. He has no wheezes. He has no rales.  Musculoskeletal:  Tenderness to palpation of lateral left knee. Full ROM. + lateral joint line tenderness. No joint effusion or swelling appreciated. No abnormal alignment or patellar mobility. No bruising, erythema or warmth overlaying the joint. No varus/valgus laxity. Negative drawer's, Lachman's  and McMurray's.  No crepitus. 2+ DP pulses bilaterally. All compartments are soft. Sensation intact distal to injury.  Neurological: He is alert.  Skin: Skin is warm and dry.  Nursing note and vitals reviewed.    ED Treatments / Results  Labs (all labs ordered are listed, but only abnormal results are displayed) Labs Reviewed - No data to display  EKG None  Radiology Dg Knee Complete 4 Views Left  Result Date: 04/05/2018 CLINICAL DATA:  Left knee pain after trauma. EXAM: LEFT KNEE - COMPLETE 4+ VIEW COMPARISON:  None. FINDINGS: There is a small joint effusion. No fractures  identified. Mild degenerative changes in the medial and lateral compartments. IMPRESSION: Mild degenerative changes.  Small effusion.  No fracture. Electronically Signed   By: Gerome Sam III M.D   On: 04/05/2018 10:24    Procedures Procedures (including critical care time)  Medications Ordered in ED Medications - No data to display   Initial Impression / Assessment and Plan / ED Course  I have reviewed the triage vital signs and the nursing notes.  Pertinent labs & imaging results that were available during my care of the patient were reviewed by me and considered in my medical decision making (see chart for details).    Brendan Ruiz is a 42 y.o. male who presents to ED for acute onset of left knee pain after twisting his knee oddly about 1 week ago.  X-ray negative.  Neurovascularly intact on exam.  He does have lateral joint line tenderness, however negative McMurray's.  We will treat symptomatically and have him follow-up with orthopedics if symptoms persist.  All questions answered.    Final Clinical Impressions(s) / ED Diagnoses   Final diagnoses:  Acute pain of left knee    ED Discharge Orders        Ordered    ibuprofen (ADVIL,MOTRIN) 800 MG tablet  Every 8 hours PRN     04/05/18 1056       Jinnie Onley, Chase Picket, PA-C 04/05/18 1101    Mackuen, Cindee Salt, MD 04/05/18 1224

## 2018-08-31 DIAGNOSIS — I10 Essential (primary) hypertension: Secondary | ICD-10-CM | POA: Insufficient documentation

## 2018-11-01 ENCOUNTER — Other Ambulatory Visit: Payer: Self-pay

## 2018-11-01 ENCOUNTER — Emergency Department (HOSPITAL_BASED_OUTPATIENT_CLINIC_OR_DEPARTMENT_OTHER)
Admission: EM | Admit: 2018-11-01 | Discharge: 2018-11-01 | Disposition: A | Discharge status: Home / Self Care | Payer: 59 | Attending: Emergency Medicine | Admitting: Emergency Medicine

## 2018-11-01 ENCOUNTER — Emergency Department (HOSPITAL_BASED_OUTPATIENT_CLINIC_OR_DEPARTMENT_OTHER): Payer: 59

## 2018-11-01 ENCOUNTER — Encounter (HOSPITAL_BASED_OUTPATIENT_CLINIC_OR_DEPARTMENT_OTHER): Payer: Self-pay | Admitting: Emergency Medicine

## 2018-11-01 DIAGNOSIS — I10 Essential (primary) hypertension: Secondary | ICD-10-CM | POA: Diagnosis not present

## 2018-11-01 DIAGNOSIS — Z87891 Personal history of nicotine dependence: Secondary | ICD-10-CM | POA: Insufficient documentation

## 2018-11-01 DIAGNOSIS — Z79899 Other long term (current) drug therapy: Secondary | ICD-10-CM | POA: Insufficient documentation

## 2018-11-01 DIAGNOSIS — R51 Headache: Secondary | ICD-10-CM | POA: Diagnosis not present

## 2018-11-01 DIAGNOSIS — R519 Headache, unspecified: Secondary | ICD-10-CM

## 2018-11-01 HISTORY — DX: Essential (primary) hypertension: I10

## 2018-11-01 HISTORY — DX: Pure hypercholesterolemia, unspecified: E78.00

## 2018-11-01 LAB — BASIC METABOLIC PANEL
Anion gap: 10 (ref 5–15)
BUN: 19 mg/dL (ref 6–20)
CO2: 21 mmol/L — ABNORMAL LOW (ref 22–32)
Calcium: 9.6 mg/dL (ref 8.9–10.3)
Chloride: 105 mmol/L (ref 98–111)
Creatinine, Ser: 1.23 mg/dL (ref 0.61–1.24)
GFR calc Af Amer: >60 mL/min (ref >60)
GFR calc non Af Amer: >60 mL/min (ref >60)
GLUCOSE: 87 mg/dL (ref 70–99)
Potassium: 4.1 mmol/L (ref 3.5–5.1)
Sodium: 136 mmol/L (ref 135–145)

## 2018-11-01 MED ORDER — PROCHLORPERAZINE EDISYLATE 10 MG/2ML IJ SOLN
10.0000 mg | Freq: Once | INTRAMUSCULAR | Status: AC
  Administered 2018-11-01: 10 mg via INTRAVENOUS
  Filled 2018-11-01: qty 2

## 2018-11-01 MED ORDER — MECLIZINE HCL 25 MG PO TABS: 25.0000 mg | ORAL_TABLET | Freq: Three times a day (TID) | ORAL | 0 refills | Status: AC | PRN

## 2018-11-01 MED ORDER — SODIUM CHLORIDE 0.9 % IV BOLUS
1000.0000 mL | Freq: Once | INTRAVENOUS | Status: AC
  Administered 2018-11-01: 1000 mL via INTRAVENOUS

## 2018-11-01 MED ORDER — IOPAMIDOL (ISOVUE-370) INJECTION 76%
100.0000 mL | Freq: Once | INTRAVENOUS | Status: AC | PRN
  Administered 2018-11-01: 100 mL via INTRAVENOUS

## 2018-11-01 MED ORDER — DEXAMETHASONE 6 MG PO TABS
10.0000 mg | ORAL_TABLET | Freq: Once | ORAL | Status: AC
  Administered 2018-11-01: 10 mg via ORAL
  Filled 2018-11-01: qty 1

## 2018-11-01 MED ORDER — DIPHENHYDRAMINE HCL 50 MG/ML IJ SOLN
25.0000 mg | Freq: Once | INTRAMUSCULAR | Status: AC
  Administered 2018-11-01: 25 mg via INTRAVENOUS
  Filled 2018-11-01: qty 1

## 2018-11-01 MED ORDER — AMOXICILLIN-POT CLAVULANATE 875-125 MG PO TABS: 1.0000 | ORAL_TABLET | Freq: Two times a day (BID) | ORAL | 0 refills | Status: DC

## 2018-11-01 NOTE — ED Triage Notes (Signed)
Pt reports headache since thanksgiving, constant sinus drainage, intermittent dizziness. Recently started on BP meds and still in the process of finding the right one. He states it has been changed several times over the past few weeks.

## 2018-11-01 NOTE — Discharge Instructions (Addendum)
Follow up with your PCP. Return for worsening symptoms, inability to walk.

## 2018-11-01 NOTE — ED Notes (Signed)
Pt will require BUN/Creat/egfr prior to imaging with IV contrast, per [facility] Radiology Protocols, hx HTN, also hx elevated creat in past, pt will also need 20 G AC IV per protocol

## 2018-11-01 NOTE — ED Provider Notes (Signed)
[facility] EMERGENCY DEPARTMENT Provider Note   CSN: [id number] Arrival date & time: 11/01/18  1413     History   Chief Complaint Chief Complaint  Patient presents with  . Headache    HPI Brendan Ruiz is a 42 y.o. male.  42 yo M with a chief complaint of cough and congestion.  This been going on for about 3 weeks now.  He is also been having posterior neck pain that radiates up to the head.  He has felt somewhat unsteady when he walks for the past few days.  Has felt dizzy when he turns his head in a certain direction.  Symptoms are usually worse in the morning.  He does at baseline feel somewhat off balance.  If he turns his head in a certain direction he will worsening which will resolve quickly with change in position but never goes away.  He denies injury to his head or neck.  Denies fevers or chills.  Denies facial pain.  The history is provided by the patient.  Headache   This is a new problem. The current episode started more than 1 week ago. The problem occurs constantly. The problem has not changed since onset.The headache is associated with nothing. The quality of the pain is described as dull. The pain is at a severity of 8/10. The pain is moderate. The pain does not radiate. Pertinent negatives include no fever, no palpitations, no shortness of breath and no vomiting. He has tried nothing for the symptoms. The treatment provided no relief.    Past Medical History:  Diagnosis Date  . High cholesterol   . Hypertension     Patient Active Problem List   Diagnosis Date Noted  . Neck pain 03/22/2014    History reviewed. No pertinent surgical history.      Home Medications    Prior to Admission medications   Medication Sig Start Date End Date Taking? Authorizing Provider  amLODipine (NORVASC) 5 MG tablet Take 5 mg by mouth daily.   Yes [provider]  atorvastatin (LIPITOR) 40 MG tablet Take 40 mg by mouth daily.   Yes [provider]  amoxicillin-clavulanate (AUGMENTIN) 875-125 MG tablet Take 1 tablet by mouth every 12 (twelve) hours. 11/01/18   Melene Plan, DO  cyclobenzaprine (FLEXERIL) 10 MG tablet Take 1 tablet (10 mg total) by mouth 3 (three) times daily as needed for muscle spasms. 07/29/17   Zadie Rhine, MD  ibuprofen (ADVIL,MOTRIN) 800 MG tablet Take 1 tablet (800 mg total) by mouth every 8 (eight) hours as needed. 04/05/18   Ward, Chase Picket, PA-C  meclizine (ANTIVERT) 25 MG tablet Take 1 tablet (25 mg total) by mouth 3 (three) times daily as needed for dizziness. 11/01/18   Melene Plan, DO  predniSONE (DELTASONE) 50 MG tablet 1 tablet PO QD X5 days 07/29/17   Zadie Rhine, MD    Family History No family history on file.  Social History Social History   Tobacco Use  . Smoking status: Former Smoker    Packs/day: 0.50    Types: Cigarettes  . Smokeless tobacco: Never Used  Substance Use Topics  . Alcohol use: No  . Drug use: No     Allergies   Patient has no known allergies.   Review of Systems Review of Systems  Constitutional: Negative for chills and fever.  HENT: Positive for congestion. Negative for facial swelling.   Eyes: Negative for discharge and visual disturbance.  Respiratory: Positive for cough. Negative  for shortness of breath.   Cardiovascular: Negative for chest pain and palpitations.  Gastrointestinal: Negative for abdominal pain, diarrhea and vomiting.  Musculoskeletal: Negative for arthralgias and myalgias.  Skin: Negative for color change and rash.  Neurological: Positive for dizziness and headaches. Negative for tremors and syncope.  Psychiatric/Behavioral: Negative for confusion and dysphoric mood.     Physical Exam Updated Vital Signs BP 139/88 (BP Location: Right Arm)   Pulse 74   Temp 98.7 F (37.1 C) (Oral)   Resp 16   Ht 5\' 11"  (1.803 m)   Wt 86.2 kg   SpO2 100%   BMI 26.50 kg/m   Physical Exam Vitals signs and nursing note reviewed.    Constitutional:      Appearance: He is well-developed.  HENT:     Head: Normocephalic and atraumatic.  Eyes:     Pupils: Pupils are equal, round, and reactive to light.  Neck:     Musculoskeletal: Normal range of motion and neck supple.     Vascular: No JVD.  Cardiovascular:     Rate and Rhythm: Normal rate and regular rhythm.     Heart sounds: No murmur. No friction rub. No gallop.   Pulmonary:     Effort: No respiratory distress.     Breath sounds: No wheezing.  Abdominal:     General: There is no distension.     Tenderness: There is no guarding or rebound.  Musculoskeletal: Normal range of motion.  Skin:    Coloration: Skin is not pale.     Findings: No rash.  Neurological:     Mental Status: He is alert and oriented to person, place, and time.     Cranial Nerves: Cranial nerves are intact. No cranial nerve deficit.     Motor: Motor function is intact.     Coordination: Coordination is intact.     Gait: Gait is intact.     Comments: Right sided fast going nystagmus  Psychiatric:        Behavior: Behavior normal.      ED Treatments / Results  Labs (all labs ordered are listed, but only abnormal results are displayed) Labs Reviewed  BASIC METABOLIC PANEL - Abnormal; Notable for the following components:      Result Value   CO2 21 (*)    All other components within normal limits    EKG None  Radiology Ct Angio Head W Or Wo Contrast  Result Date: 11/01/2018 CLINICAL DATA:  Headache for 3 weeks. Dizziness. Posterior neck pain extending to the shoulders. EXAM: CT ANGIOGRAPHY HEAD AND NECK TECHNIQUE: Multidetector CT imaging of the head and neck was performed using the standard protocol during bolus administration of intravenous contrast. Multiplanar CT image reconstructions and MIPs were obtained to evaluate the vascular anatomy. Carotid stenosis measurements (when applicable) are obtained utilizing NASCET criteria, using the distal internal carotid diameter as the  denominator. CONTRAST:  ISOVUE-370 IOPAMIDOL (ISOVUE-370) INJECTION 76% COMPARISON:  None. FINDINGS: CT HEAD FINDINGS Brain: There is no evidence of acute infarct, intracranial hemorrhage, mass, midline shift, or extra-axial fluid collection. The ventricles and sulci are normal. Vascular: Evaluated below. Skull: No fracture or focal osseous lesion. Sinuses: Mild mucosal thickening in the bilateral maxillary and ethmoid sinuses. Clear mastoid air cells. Orbits: Unremarkable. Review of the MIP images confirms the above findings CTA NECK FINDINGS Aortic arch: Standard 3 vessel aortic arch with mild atherosclerosis. Widely patent arch vessel origins. Right carotid system: Patent with eccentric calcified plaque at the carotid  bifurcation. No evidence of dissection or stenosis. Left carotid system: Patent with minimal plaque at the common carotid artery origin and at the carotid bifurcation. No evidence of dissection or stenosis. Vertebral arteries: Patent with the left being mildly dominant. Minimal calcified plaque at the left vertebral artery origin without evidence of significant stenosis or dissection. Skeleton: Mild cervical spondylosis. Other neck: No evidence of acute abnormality or mass. Upper chest: Clear lung apices. Review of the MIP images confirms the above findings CTA HEAD FINDINGS Anterior circulation: The internal carotid arteries are widely patent from skull base to carotid termini. ACAs and MCAs are patent without evidence of proximal branch occlusion or significant proximal stenosis. The right ACA is dominant. No aneurysm is identified. Posterior circulation: The intracranial vertebral arteries are patent to the basilar. Patent right PICA, left AICA, and bilateral SCA origins are identified. The basilar artery is patent and mildly small in caliber diffusely on a congenital basis without focal stenosis. There is a medium-sized left posterior communicating artery with mild hypoplasia of the left  P1 segment. Both PCAs are patent without evidence of significant stenosis. No aneurysm is identified. Venous sinuses: Patent. Anatomic variants: None of significance. Delayed phase: No abnormal enhancement. Review of the MIP images confirms the above findings IMPRESSION: 1. Unremarkable CT appearance of the brain. 2. Mild atherosclerosis in the neck. 3. No large vessel occlusion, significant stenosis, or aneurysm in the head or neck. 4.  Aortic Atherosclerosis (ICD10-I70.0). Electronically Signed   By: Sebastian Ache M.D.   On: 11/01/2018 18:45   Ct Angio Neck W And/or Wo Contrast  Result Date: 11/01/2018 CLINICAL DATA:  Headache for 3 weeks. Dizziness. Posterior neck pain extending to the shoulders. EXAM: CT ANGIOGRAPHY HEAD AND NECK TECHNIQUE: Multidetector CT imaging of the head and neck was performed using the standard protocol during bolus administration of intravenous contrast. Multiplanar CT image reconstructions and MIPs were obtained to evaluate the vascular anatomy. Carotid stenosis measurements (when applicable) are obtained utilizing NASCET criteria, using the distal internal carotid diameter as the denominator. CONTRAST:  ISOVUE-370 IOPAMIDOL (ISOVUE-370) INJECTION 76% COMPARISON:  None. FINDINGS: CT HEAD FINDINGS Brain: There is no evidence of acute infarct, intracranial hemorrhage, mass, midline shift, or extra-axial fluid collection. The ventricles and sulci are normal. Vascular: Evaluated below. Skull: No fracture or focal osseous lesion. Sinuses: Mild mucosal thickening in the bilateral maxillary and ethmoid sinuses. Clear mastoid air cells. Orbits: Unremarkable. Review of the MIP images confirms the above findings CTA NECK FINDINGS Aortic arch: Standard 3 vessel aortic arch with mild atherosclerosis. Widely patent arch vessel origins. Right carotid system: Patent with eccentric calcified plaque at the carotid bifurcation. No evidence of dissection or stenosis. Left carotid system: Patent  with minimal plaque at the common carotid artery origin and at the carotid bifurcation. No evidence of dissection or stenosis. Vertebral arteries: Patent with the left being mildly dominant. Minimal calcified plaque at the left vertebral artery origin without evidence of significant stenosis or dissection. Skeleton: Mild cervical spondylosis. Other neck: No evidence of acute abnormality or mass. Upper chest: Clear lung apices. Review of the MIP images confirms the above findings CTA HEAD FINDINGS Anterior circulation: The internal carotid arteries are widely patent from skull base to carotid termini. ACAs and MCAs are patent without evidence of proximal branch occlusion or significant proximal stenosis. The right ACA is dominant. No aneurysm is identified. Posterior circulation: The intracranial vertebral arteries are patent to the basilar. Patent right PICA, left AICA, and bilateral SCA origins  are identified. The basilar artery is patent and mildly small in caliber diffusely on a congenital basis without focal stenosis. There is a medium-sized left posterior communicating artery with mild hypoplasia of the left P1 segment. Both PCAs are patent without evidence of significant stenosis. No aneurysm is identified. Venous sinuses: Patent. Anatomic variants: None of significance. Delayed phase: No abnormal enhancement. Review of the MIP images confirms the above findings IMPRESSION: 1. Unremarkable CT appearance of the brain. 2. Mild atherosclerosis in the neck. 3. No large vessel occlusion, significant stenosis, or aneurysm in the head or neck. 4.  Aortic Atherosclerosis (ICD10-I70.0). Electronically Signed   By: Sebastian AcheAllen  Grady M.D.   On: 11/01/2018 18:45    Procedures Procedures (including critical care time)  Medications Ordered in ED Medications  prochlorperazine (COMPAZINE) injection 10 mg (10 mg Intravenous Given 11/01/18 1722)  diphenhydrAMINE (BENADRYL) injection 25 mg (25 mg Intravenous Given 11/01/18  1721)  sodium chloride 0.9 % bolus 1,000 mL (0 mLs Intravenous Stopped 11/01/18 1916)  dexamethasone (DECADRON) tablet 10 mg (10 mg Oral Given 11/01/18 1720)  iopamidol (ISOVUE-370) 76 % injection 100 mL (100 mLs Intravenous Contrast Given 11/01/18 1801)     Initial Impression / Assessment and Plan / ED Course  I have reviewed the triage vital signs and the nursing notes.  Pertinent labs & imaging results that were available during my care of the patient were reviewed by me and considered in my medical decision making (see chart for details).     42 yo M with a chief complaint of cough congestion and dizziness.  This all may be related to a sinus infection that the patient has no frontal sinus pain.  His pain is more in the neck and radiates up to the head.  This may be concern for the possibility of vertebral artery dissection.  He has a benign neurologic exam for me.  Has some subjective feeling of off balance.  I discussed different possible work-ups with the family including starting him on antibiotics for sinusitis and having him follow-up with the family doctor versus imaging while he is here in the department and they are electing for imaging.  We will obtain a CT angiogram of the head and the neck. CT is negative.  Patient is feeling much better on reassessment.  Discharge home.  7:31 PM:  I have discussed the diagnosis/risks/treatment options with the patient and family and believe the pt to be eligible for discharge home to follow-up with PCP. We also discussed returning to the ED immediately if new or worsening sx occur. We discussed the sx which are most concerning (e.g., sudden worsening pain, fever,stroke s/sx) that necessitate immediate return. Medications administered to the patient during their visit and any new prescriptions provided to the patient are listed below.  Medications given during this visit Medications  prochlorperazine (COMPAZINE) injection 10 mg (10 mg Intravenous  Given 11/01/18 1722)  diphenhydrAMINE (BENADRYL) injection 25 mg (25 mg Intravenous Given 11/01/18 1721)  sodium chloride 0.9 % bolus 1,000 mL (0 mLs Intravenous Stopped 11/01/18 1916)  dexamethasone (DECADRON) tablet 10 mg (10 mg Oral Given 11/01/18 1720)  iopamidol (ISOVUE-370) 76 % injection 100 mL (100 mLs Intravenous Contrast Given 11/01/18 1801)    The patient appears reasonably screen and/or stabilized for discharge and I doubt any other medical condition or other [facility] requiring further screening, evaluation, or treatment in the ED at this time prior to discharge.    Final Clinical Impressions(s) / ED Diagnoses   Final diagnoses:  Bad headache  Sinus headache    ED Discharge Orders         Ordered    amoxicillin-clavulanate (AUGMENTIN) 875-125 MG tablet  Every 12 hours     11/01/18 1902    meclizine (ANTIVERT) 25 MG tablet  3 times daily PRN     11/01/18 1902           Melene PlanFloyd, Zarriah Starkel, DO 11/01/18 1931

## 2018-11-27 ENCOUNTER — Encounter: Payer: Self-pay | Admitting: Family Medicine

## 2018-11-27 ENCOUNTER — Ambulatory Visit (INDEPENDENT_AMBULATORY_CARE_PROVIDER_SITE_OTHER): Payer: Managed Care, Other (non HMO) | Admitting: Family Medicine

## 2018-11-27 VITALS — BP 148/92 | HR 77 | Ht 71.0 in | Wt 193.0 lb

## 2018-11-27 DIAGNOSIS — M542 Cervicalgia: Secondary | ICD-10-CM

## 2018-11-27 MED ORDER — METHOCARBAMOL 500 MG PO TABS: 500.0000 mg | ORAL_TABLET | Freq: Three times a day (TID) | ORAL | 1 refills | Status: AC | PRN

## 2018-11-27 MED ORDER — PREDNISONE 10 MG PO TABS: ORAL_TABLET | ORAL | 0 refills | Status: AC

## 2018-11-27 NOTE — Patient Instructions (Signed)
You have trapezius, cervical strains of your neck Take prednisone dose pack as directed for 6 days. Day AFTER finishing prednisone you could take aleve 2 tabs twice a day with food if needed. Robaxin three times a day as needed for muscle spasms (do not drive with this if it makes you sleepy). Consider cervical collar if severely painful. Simple range of motion exercises within limits of pain to prevent further stiffness. Consider physical therapy for stretching, exercises, traction, and modalities. Heat 15 minutes at a time 3-4 times a day to help with spasms. Follow up with me in 1 month.

## 2018-11-27 NOTE — Progress Notes (Signed)
PCP: Patient, No Pcp Per  Subjective:   HPI: Patient is a 43 y.o. male here for neck pain.  Patient reports he's done well since last visit. Then about 3-4 months ago started to get pain in neck more on the right side. Pain level up to 5/10 and sharp, worse with turning his head. No injury or trauma. Thinks he might have slept wrong prompting this. Taking tylenol, ibuprofen as needed. No bowel/bladder dysfunction. No radiation into arms. No numbness.  Past Medical History:  Diagnosis Date  . High cholesterol   . Hypertension     Current Outpatient Medications on File Prior to Visit  Medication Sig Dispense Refill  . amLODipine (NORVASC) 5 MG tablet Take 5 mg by mouth daily.    Marland Kitchen. amoxicillin-clavulanate (AUGMENTIN) 875-125 MG tablet Take 1 tablet by mouth every 12 (twelve) hours. 14 tablet 0  . atorvastatin (LIPITOR) 40 MG tablet Take 40 mg by mouth daily.    . cyclobenzaprine (FLEXERIL) 10 MG tablet Take 1 tablet (10 mg total) by mouth 3 (three) times daily as needed for muscle spasms. 10 tablet 0  . ibuprofen (ADVIL,MOTRIN) 800 MG tablet Take 1 tablet (800 mg total) by mouth every 8 (eight) hours as needed. 21 tablet 0  . meclizine (ANTIVERT) 25 MG tablet Take 1 tablet (25 mg total) by mouth 3 (three) times daily as needed for dizziness. 30 tablet 0   No current facility-administered medications on file prior to visit.     No past surgical history on file.  No Known Allergies  Social History   Socioeconomic History  . Marital status: Married    Spouse name: Not on file  . Number of children: Not on file  . Years of education: Not on file  . Highest education level: Not on file  Occupational History  . Not on file  Social Needs  . Financial resource strain: Not on file  . Food insecurity:    Worry: Not on file    Inability: Not on file  . Transportation needs:    Medical: Not on file    Non-medical: Not on file  Tobacco Use  . Smoking status: Former Smoker    Packs/day: 0.50    Types: Cigarettes  . Smokeless tobacco: Never Used  Substance and Sexual Activity  . Alcohol use: No  . Drug use: No  . Sexual activity: Never    Birth control/protection: None  Lifestyle  . Physical activity:    Days per week: Not on file    Minutes per session: Not on file  . Stress: Not on file  Relationships  . Social connections:    Talks on phone: Not on file    Gets together: Not on file    Attends religious service: Not on file    Active member of club or organization: Not on file    Attends meetings of clubs or organizations: Not on file    Relationship status: Not on file  . Intimate partner violence:    Fear of current or ex partner: Not on file    Emotionally abused: Not on file    Physically abused: Not on file    Forced sexual activity: Not on file  Other Topics Concern  . Not on file  Social History Narrative  . Not on file    No family history on file.  BP (!) 148/92   Pulse 77   Ht 5\' 11"  (1.803 m)   Wt 193 lb (87.5 kg)  BMI 26.92 kg/m   Review of Systems: See HPI above.     Objective:  Physical Exam:  Gen: NAD, comfortable in exam room  Neck: No gross deformity, swelling, bruising. TTP right cervical paraspinal region, trapezius.  No midline/bony TTP. FROM with pain on lateral rotations, extension. BUE strength 5/5.   Sensation intact to light touch.   2+ equal reflexes in triceps, biceps, brachioradialis tendons. NV intact distal BUEs.  Bilateral shoulders: No deformity. FROM with 5/5 strength. No tenderness to palpation. NVI distally.   Assessment & Plan:  1. Neck pain - 2/2 trapezius, cervical strains of neck.  Discussed options - he would like to take prednisone dose pack and do home exercises which were reviewed today.  Robaxin as needed.  Consider physical therapy.  Heat.  F/u in 1 month.

## 2018-12-28 ENCOUNTER — Ambulatory Visit (INDEPENDENT_AMBULATORY_CARE_PROVIDER_SITE_OTHER): Payer: Managed Care, Other (non HMO) | Admitting: Family Medicine

## 2018-12-28 ENCOUNTER — Encounter: Payer: Self-pay | Admitting: Family Medicine

## 2018-12-28 VITALS — BP 129/80 | HR 72 | Ht 71.0 in | Wt 192.0 lb

## 2018-12-28 DIAGNOSIS — M542 Cervicalgia: Secondary | ICD-10-CM | POA: Diagnosis not present

## 2018-12-28 DIAGNOSIS — M25562 Pain in left knee: Secondary | ICD-10-CM

## 2018-12-28 DIAGNOSIS — M25561 Pain in right knee: Secondary | ICD-10-CM | POA: Diagnosis not present

## 2018-12-28 NOTE — Patient Instructions (Signed)
Your knee pain is consistent with patellofemoral syndrome. Avoid painful activities when possible (often deep squats, lunges bother this). Cross train with swimming, cycling with low resistance, elliptical if needed. Straight leg raise, hip side raises, straight leg raises with foot turned outwards 3 sets of 10 once a day. Add ankle weight if these become too easy. Consider formal physical therapy. Avoid flat shoes, barefoot walking as much as possible. Icing 15 minutes at a time 3-4 times a day as needed. Tylenol or ibuprofen as needed for pain. Follow up with me in 6 weeks or as needed if you're doing well.

## 2018-12-28 NOTE — Progress Notes (Signed)
  Brendan Ruiz - 43 y.o. male MRN [id number]  Date of birth: [birth year]    SUBJECTIVE:      Chief Complaint: neck pain followup. B/l knee pain  HPI:  Patient presents today for follow-up.  Reports improvement in the neck pain.  0/10 pain today.  He now only occasionally feels some right-sided stiffness and aching in his trapezius.  He has continued to home exercises.  He continues to use Robaxin on a as needed basis.  He denies any Numbness or tingling or weakness in the upper extremities.    Today he also notes having bilateral knee pain of the lateral knees going down stairs or climbing into his truck. Right worse than left.  He denies any specific injury.  He occasionally uses ibuprofen for pain.  He wears compression knee sleeves bilaterally.  He denies any swelling, bruising, erythema.  No skin changes. No locking or giving out, although at times they feel like they want to give out.   ROS:     See HPI  PERTINENT  PMH / PSH FH / / SH:  Past Medical, Surgical, Social, and Family History Reviewed & Updated in the EMR.    OBJECTIVE: BP 129/80   Pulse 72   Ht 5\' 11"  (1.803 m)   Wt 192 lb (87.1 kg)   BMI 26.78 kg/m   Physical Exam:  Vital signs are reviewed.  GEN: Alert and oriented, NAD Pulm: Breathing unlabored PSY: normal mood, congruent affect  C-spine: No deformity No midline TTP. No ttp of the trapezius Full ROM NV intact distal UEs  MSK: Right Knee: - Inspection: no gross deformity. No swelling/effusion, erythema or bruising. Skin intact - Palpation: no TTP - ROM: full active ROM with flexion and extension in knee and hip - Strength: 5/5 strength - Neuro/vasc: NV intact - Special Tests: - LIGAMENTS: negative Lachman's, no MCL or LCL laxity  -- MENISCUS: negative McMurray's -- PF JOINT: nml patellar mobility bilaterally.  negative patellar grind, negative patellar apprehension  Left Knee: - Inspection: no gross deformity. No swelling/effusion, erythema or  bruising. Skin intact - Palpation: no TTP - ROM: full active ROM with flexion and extension in knee and hip - Strength: 5/5 strength - Neuro/vasc: NV intact - Special Tests: - LIGAMENTS: negative Lachman's, no MCL or LCL laxity  -- MENISCUS: negative McMurray's -- PF JOINT: nml patellar mobility bilaterally.  negative patellar grind, negative patellar apprehension  ASSESSMENT & PLAN:  1. Trapezius strain -overall this is resolved.  Recommend continued home exercises.  Continue Robaxin as needed.  Follow-up as needed  2.  Bilateral knee pain- suspect he is having some patellofemoral pain symptoms. - Discussed and showed home exercises for quadricep and hip abductor strengthening - Tylenol or ibuprofen as needed - Follow-up in 6 weeks as needed

## 2020-07-03 IMAGING — CT CT ANGIO NECK
1 of 14 series · 5 of 33 positions shown · IV contrast (APPLIED)
Comparison: None.

CLINICAL DATA: Headache for 3 weeks. Dizziness. Posterior neck pain
extending to the shoulders.

EXAM:
CT ANGIOGRAPHY HEAD AND NECK
TECHNIQUE: Multidetector CT imaging of the head and neck was performed using
the standard protocol during bolus administration of intravenous
contrast. Multiplanar CT image reconstructions and MIPs were
obtained to evaluate the vascular anatomy. Carotid stenosis
measurements (when applicable) are obtained utilizing NASCET
criteria, using the distal internal carotid diameter as the
denominator.
CONTRAST:  100mL GYRE36-0N0 IOPAMIDOL (GYRE36-0N0) INJECTION 76%

[Series 13: axial thin · axial · 0.46mm/px · z∈[-381,-97]mm · 5 of 426 slices shown]
[im 71/426  soft-tissue]
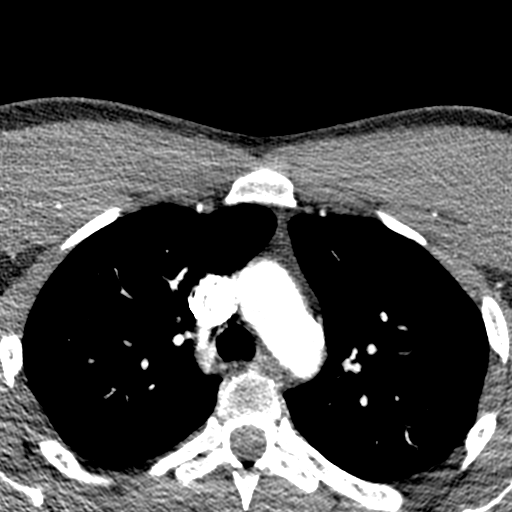
[im 142/426  bone]
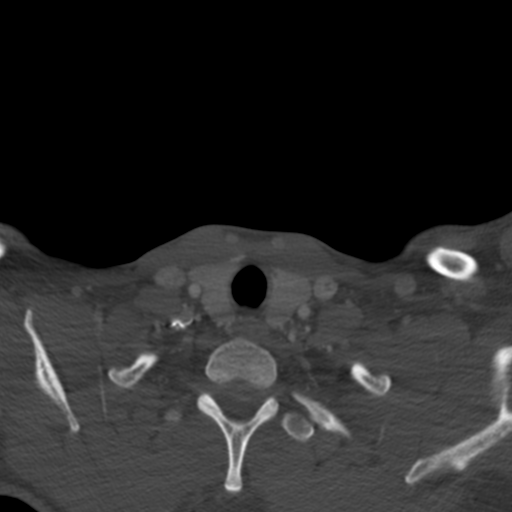
[im 213/426  soft-tissue]
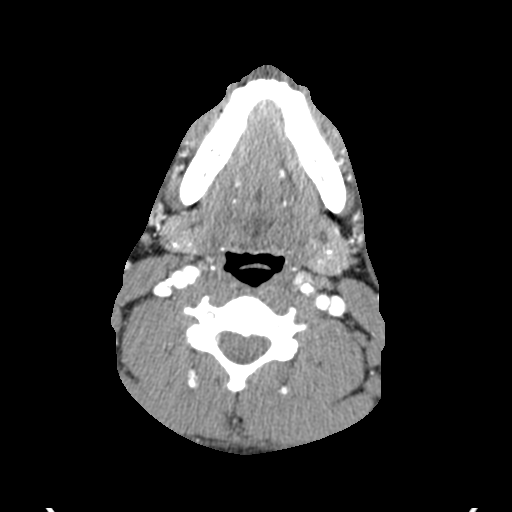
[im 284/426  bone]
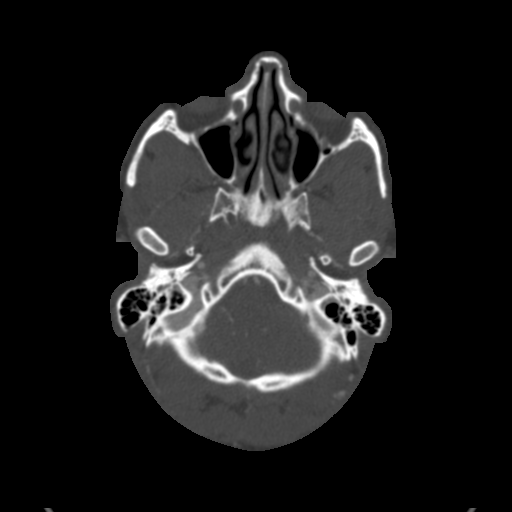
[im 355/426  soft-tissue]
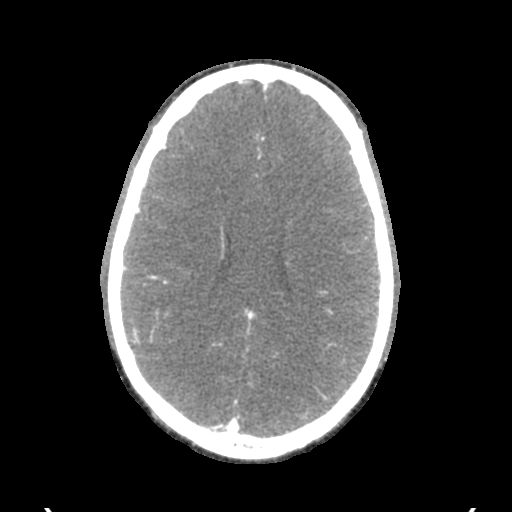

[5 of 33 positions shown; findings below may reference images not displayed]

FINDINGS: CT HEAD FINDINGS

Brain: There is no evidence of acute infarct, intracranial
hemorrhage, mass, midline shift, or extra-axial fluid collection.
The ventricles and sulci are normal.

Vascular: Evaluated below.

Skull: No fracture or focal osseous lesion.

Sinuses: Mild mucosal thickening in the bilateral maxillary and
ethmoid sinuses. Clear mastoid air cells.

Orbits: Unremarkable.

Review of the MIP images confirms the above findings

CTA NECK FINDINGS

Aortic arch: Standard 3 vessel aortic arch with mild
atherosclerosis. Widely patent arch vessel origins.

Right carotid system: Patent with eccentric calcified plaque at the
carotid bifurcation. No evidence of dissection or stenosis.

Left carotid system: Patent with minimal plaque at the common
carotid artery origin and at the carotid bifurcation. No evidence of
dissection or stenosis.

Vertebral arteries: Patent with the left being mildly dominant.
Minimal calcified plaque at the left vertebral artery origin without
evidence of significant stenosis or dissection.

Skeleton: Mild cervical spondylosis.

Other neck: No evidence of acute abnormality or mass.

Upper chest: Clear lung apices.

Review of the MIP images confirms the above findings

CTA HEAD FINDINGS

Anterior circulation: The internal carotid arteries are widely
patent from skull base to carotid termini. ACAs and MCAs are patent
without evidence of proximal branch occlusion or significant
proximal stenosis. The right ACA is dominant. No aneurysm is
identified.

Posterior circulation: The intracranial vertebral arteries are
patent to the basilar. Patent right PICA, left AICA, and bilateral
SCA origins are identified. The basilar artery is patent and mildly
small in caliber diffusely on a congenital basis without focal
stenosis. There is a medium-sized left posterior communicating
artery with mild hypoplasia of the left P1 segment. Both PCAs are
patent without evidence of significant stenosis. No aneurysm is
identified.

Venous sinuses: Patent.

Anatomic variants: None of significance.

Delayed phase: No abnormal enhancement.

Review of the MIP images confirms the above findings
IMPRESSION: 1. Unremarkable CT appearance of the brain.
2. Mild atherosclerosis in the neck.
3. No large vessel occlusion, significant stenosis, or aneurysm in
the head or neck.
4.  Aortic Atherosclerosis ([id number]).

## 2022-09-17 ENCOUNTER — Other Ambulatory Visit: Payer: Self-pay

## 2022-09-17 ENCOUNTER — Encounter (HOSPITAL_BASED_OUTPATIENT_CLINIC_OR_DEPARTMENT_OTHER): Payer: Self-pay | Admitting: Emergency Medicine

## 2022-09-17 ENCOUNTER — Other Ambulatory Visit (HOSPITAL_BASED_OUTPATIENT_CLINIC_OR_DEPARTMENT_OTHER): Payer: Self-pay

## 2022-09-17 ENCOUNTER — Emergency Department (HOSPITAL_BASED_OUTPATIENT_CLINIC_OR_DEPARTMENT_OTHER)
Admission: EM | Admit: 2022-09-17 | Discharge: 2022-09-17 | Disposition: A | Discharge status: Home / Self Care | Payer: 59 | Attending: Emergency Medicine | Admitting: Emergency Medicine

## 2022-09-17 DIAGNOSIS — K0889 Other specified disorders of teeth and supporting structures: Secondary | ICD-10-CM | POA: Insufficient documentation

## 2022-09-17 MED ORDER — PENICILLIN V POTASSIUM 500 MG PO TABS: 500.0000 mg | ORAL_TABLET | Freq: Four times a day (QID) | ORAL | 0 refills | Status: DC

## 2022-09-17 MED ORDER — PENICILLIN V POTASSIUM 250 MG PO TABS
500.0000 mg | ORAL_TABLET | Freq: Once | ORAL | Status: AC
  Administered 2022-09-17: 500 mg via ORAL
  Filled 2022-09-17: qty 2

## 2022-09-17 MED ORDER — ACETAMINOPHEN 500 MG PO TABS
1000.0000 mg | ORAL_TABLET | Freq: Once | ORAL | Status: AC
  Administered 2022-09-17: 1000 mg via ORAL
  Filled 2022-09-17: qty 2

## 2022-09-17 MED ORDER — PENICILLIN V POTASSIUM 500 MG PO TABS: 500.0000 mg | ORAL_TABLET | Freq: Four times a day (QID) | ORAL | 0 refills | Status: AC

## 2022-09-17 MED ORDER — PENICILLIN V POTASSIUM 500 MG PO TABS
500.0000 mg | ORAL_TABLET | Freq: Four times a day (QID) | ORAL | 0 refills | Status: AC
  Filled 2022-09-17: qty 20, 5d supply, fill #0

## 2022-09-17 MED ORDER — KETOROLAC TROMETHAMINE 15 MG/ML IJ SOLN
15.0000 mg | Freq: Once | INTRAMUSCULAR | Status: AC
  Administered 2022-09-17: 15 mg via INTRAMUSCULAR
  Filled 2022-09-17: qty 1

## 2022-09-17 NOTE — ED Provider Notes (Signed)
[facility] EMERGENCY DEPARTMENT Provider Note   CSN: [id number] Arrival date & time: 09/17/22  0827     History Chief Complaint  Patient presents with   Dental Pain    HPI Brendan Ruiz is a 46 y.o. male presenting for dental pain.  He is a 46 year old male with a minimal medical history.  He has extensive dental disease at baseline.  He has been lost to follow-up with a dentist because of insurance issues.  He took Tylenol with some improvement.  He endorses that heat and cold improve his symptoms and then it comes back more substantial afterwards.  He denies fevers or chills, nausea vomiting compassing shortness of breath.  No lower facial swelling, no posterior oropharyngeal swelling.   Patient's recorded medical, surgical, social, medication list and allergies were reviewed in the Snapshot window as part of the initial history.   Review of Systems   Review of Systems  Constitutional:  Negative for chills and fever.  HENT:  Positive for dental problem. Negative for ear pain and sore throat.   Eyes:  Negative for pain and visual disturbance.  Respiratory:  Negative for cough and shortness of breath.   Cardiovascular:  Negative for chest pain and palpitations.  Gastrointestinal:  Negative for abdominal pain and vomiting.  Genitourinary:  Negative for dysuria and hematuria.  Musculoskeletal:  Negative for arthralgias and back pain.  Skin:  Negative for color change and rash.  Neurological:  Negative for seizures and syncope.  All other systems reviewed and are negative.   Physical Exam Updated Vital Signs BP 135/82   Pulse 92   Temp 97.7 F (36.5 C) (Oral)   Resp 16   Ht 5\' 11"  (1.803 m)   Wt 86.2 kg   SpO2 100%   BMI 26.50 kg/m  Physical Exam Vitals and nursing note reviewed.  Constitutional:      General: He is not in acute distress.    Appearance: He is well-developed.  HENT:     Head: Normocephalic and atraumatic.     Mouth/Throat:     Comments:  Extensive intraoral advanced dental disease.  Multiple dental caries. Eyes:     Conjunctiva/sclera: Conjunctivae normal.  Cardiovascular:     Rate and Rhythm: Normal rate and regular rhythm.     Heart sounds: No murmur heard. Pulmonary:     Effort: Pulmonary effort is normal. No respiratory distress.     Breath sounds: Normal breath sounds.  Abdominal:     Palpations: Abdomen is soft.     Tenderness: There is no abdominal tenderness.  Musculoskeletal:        General: No swelling.     Cervical back: Neck supple.  Skin:    General: Skin is warm and dry.     Capillary Refill: Capillary refill takes less than 2 seconds.  Neurological:     Mental Status: He is alert.  Psychiatric:        Mood and Affect: Mood normal.      ED Course/ Medical Decision Making/ A&P    Procedures Procedures   Medications Ordered in ED Medications  acetaminophen (TYLENOL) tablet 1,000 mg (has no administration in time range)  ketorolac (TORADOL) 15 MG/ML injection 15 mg (has no administration in time range)  penicillin v potassium (VEETID) tablet 500 mg (has no administration in time range)    Medical Decision Making:    [name] is a 46 y.o. male who presented to the ED today with dental pain detailed above.  Patient's presentation is complicated by their history of socioeconomic disruption and loss of insurance, difficulty obtaining follow-up with specialist providers and dentistry.  Patient placed on continuous vitals and telemetry monitoring while in ED which was reviewed periodically.   Complete initial physical exam performed, notably the patient  was hemodynamically stable in no acute distress.  In particular no posterior oropharyngeal swelling, tonsil base is visualized without gross swelling, uvula not grossly swollen.  Patient swallowing secretions without difficulty. Reviewed and confirmed nursing documentation for past medical history, family history, social history.     Initial Assessment:   With the patient's presentation of dental pain, most likely diagnosis is pulpitis. Other diagnoses were considered including (but not limited to) abscess, retropharyngeal abscess, Ludwick's angina, peritonsillar abscess. These are considered less likely due to history of present illness and physical exam findings.   This is most consistent with an acute life/limb threatening illness complicated by underlying chronic conditions.  Initial Plan:  As patient is having symptomatic improvement with heat and cold, likely some component of reversible pulpitis.  We will start him on antibiotics including penicillin 4 times daily with plan for patient to follow-up with primary care provider in outpatient setting.  Resources for local dental resources placed in patient's chart to prevent progression of disease.  Disposition:  I have considered need for hospitalization, however, considering all of the above, I believe this patient is stable for discharge at this time.  Patient/family educated about specific return precautions for given chief complaint and symptoms.  Patient/family educated about follow-up with PCP.     Patient/family expressed understanding of return precautions and need for follow-up. Patient spoken to regarding all imaging and laboratory results and appropriate follow up for these results. All education provided in verbal form with additional information in written form. Time was allowed for answering of patient questions. Patient discharged.    Emergency Department Medication Summary:   Medications  acetaminophen (TYLENOL) tablet 1,000 mg (has no administration in time range)  ketorolac (TORADOL) 15 MG/ML injection 15 mg (has no administration in time range)  penicillin v potassium (VEETID) tablet 500 mg (has no administration in time range)    Clinical Impression:  1. Pain, dental      Discharge   Final Clinical Impression(s) / ED Diagnoses Final  diagnoses:  Pain, dental    Rx / DC Orders ED Discharge Orders          Ordered    penicillin v potassium (VEETID) 500 MG tablet  4 times daily,   Status:  Discontinued        09/17/22 0859    penicillin v potassium (VEETID) 500 MG tablet  4 times daily        09/17/22 0901              Glyn Ade, MD 09/17/22 [phone]

## 2022-09-17 NOTE — ED Triage Notes (Signed)
Left upper dental pain with swelling on gums.

## 2022-09-17 NOTE — Discharge Instructions (Addendum)
You were seen today for dental pain. We did not identify any emergent cause for your symptoms. Your evaluation is most consistent with dental caries and pulpitis.   Plan and next steps:   The following may be helpful in managing your symptoms:   Pain- Lidocaine Patches  Apply to affected area for up to 12 hours at a time.   Pain/Fever- Adult Tylenol dosing:  650 mg orally every 4 to 6 hours as needed, MAX: 3250 mg/24 hours   (Extra-strength) 1000 mg orally every 6 hours as needed; MAX: 3000 mg/24 hours   Do not use if you have liver disease. Read the label on the bottle.   Pain/Fever- Adult Ibuprofen Dosing  200 to 400 mg orally every 4 to 6 hours as needed; MAX 1200 mg/day; do not take longer than 10 days   Do not use if you have kidney disease. Read the label on the bottle   Findings:  You may see all of your lab and imaging results utilizing our online portal! Look in this document or ask a team member for your mychart* access information. The most notable results have additionally been verbally communicated with you and your bedside family.    Follow-up Plan:   Follow up with the patient's normal primary care provider for monitoring of this condition within 48 hours.   Signs/Symptoms that would warrant return to the ED:  Please return to the ED if you experience worsening of symptoms or any abrupt changes in your health. Standard of care precautions for your chief complaint have already been verbally communicated with you. Always be on alert for fevers, chills, shortness of breath, chest pains, or sudden changes that warrant immediate evaluation.    Thank you for allowing [place] to be a part of you and your families' care.   Glyn Ade MD
